# Patient Record
Sex: Male | Born: 1955
Health system: Southern US, Community
[De-identification: ages and names within clinical notes are randomized; demographics above are authoritative.]

## PROBLEM LIST (undated history)

## (undated) DIAGNOSIS — IMO0001 Reserved for inherently not codable concepts without codable children: Secondary | ICD-10-CM

## (undated) DIAGNOSIS — I10 Essential (primary) hypertension: Secondary | ICD-10-CM

## (undated) DIAGNOSIS — R0602 Shortness of breath: Secondary | ICD-10-CM

## (undated) DIAGNOSIS — D696 Thrombocytopenia, unspecified: Secondary | ICD-10-CM

## (undated) DIAGNOSIS — N289 Disorder of kidney and ureter, unspecified: Secondary | ICD-10-CM

## (undated) DIAGNOSIS — M2550 Pain in unspecified joint: Secondary | ICD-10-CM

## (undated) DIAGNOSIS — M1990 Unspecified osteoarthritis, unspecified site: Secondary | ICD-10-CM

## (undated) DIAGNOSIS — H409 Unspecified glaucoma: Secondary | ICD-10-CM

## (undated) DIAGNOSIS — B029 Zoster without complications: Secondary | ICD-10-CM

## (undated) DIAGNOSIS — M25569 Pain in unspecified knee: Secondary | ICD-10-CM

## (undated) DIAGNOSIS — G4730 Sleep apnea, unspecified: Secondary | ICD-10-CM

## (undated) DIAGNOSIS — E079 Disorder of thyroid, unspecified: Secondary | ICD-10-CM

## (undated) DIAGNOSIS — H269 Unspecified cataract: Secondary | ICD-10-CM

## (undated) DIAGNOSIS — E785 Hyperlipidemia, unspecified: Secondary | ICD-10-CM

## (undated) DIAGNOSIS — M549 Dorsalgia, unspecified: Secondary | ICD-10-CM

## (undated) DIAGNOSIS — G4733 Obstructive sleep apnea (adult) (pediatric): Secondary | ICD-10-CM

## (undated) DIAGNOSIS — L719 Rosacea, unspecified: Secondary | ICD-10-CM

## (undated) DIAGNOSIS — R600 Localized edema: Secondary | ICD-10-CM

## (undated) DIAGNOSIS — E669 Obesity, unspecified: Secondary | ICD-10-CM

## (undated) DIAGNOSIS — E039 Hypothyroidism, unspecified: Secondary | ICD-10-CM

## (undated) DIAGNOSIS — G2581 Restless legs syndrome: Secondary | ICD-10-CM

## (undated) HISTORY — DX: Disorder of thyroid, unspecified: E07.9

## (undated) HISTORY — DX: Unspecified osteoarthritis, unspecified site: M19.90

## (undated) HISTORY — DX: Hyperlipidemia, unspecified: E78.5

## (undated) HISTORY — DX: Shortness of breath: R06.02

## (undated) HISTORY — PX: SIGMOIDOSCOPY: SUR1295

## (undated) HISTORY — DX: Restless legs syndrome: G25.81

## (undated) HISTORY — PX: CARPAL TUNNEL RELEASE: SHX101

## (undated) HISTORY — DX: Disorder of kidney and ureter, unspecified: N28.9

## (undated) HISTORY — PX: WISDOM TOOTH EXTRACTION: SHX21

## (undated) HISTORY — DX: Dorsalgia, unspecified: M54.9

## (undated) HISTORY — PX: LASIK: SHX215

## (undated) HISTORY — DX: Unspecified glaucoma: H40.9

## (undated) HISTORY — DX: Hypothyroidism, unspecified: E03.9

## (undated) HISTORY — DX: Thrombocytopenia, unspecified: D69.6

## (undated) HISTORY — PX: EYE SURGERY: SHX253

## (undated) HISTORY — DX: Obstructive sleep apnea (adult) (pediatric): G47.33

## (undated) HISTORY — PX: POLYPECTOMY: SHX149

## (undated) HISTORY — DX: Zoster without complications: B02.9

## (undated) HISTORY — DX: Reserved for inherently not codable concepts without codable children: IMO0001

## (undated) HISTORY — DX: Sleep apnea, unspecified: G47.30

## (undated) HISTORY — DX: Essential (primary) hypertension: I10

## (undated) HISTORY — DX: Obesity, unspecified: E66.9

## (undated) HISTORY — DX: Localized edema: R60.0

## (undated) HISTORY — DX: Pain in unspecified joint: M25.50

## (undated) HISTORY — DX: Rosacea, unspecified: L71.9

## (undated) HISTORY — DX: Pain in unspecified knee: M25.569

## (undated) HISTORY — DX: Unspecified cataract: H26.9

## (undated) HISTORY — PX: HERNIA REPAIR: SHX51

---

## 2004-07-02 ENCOUNTER — Ambulatory Visit: Payer: Self-pay | Admitting: Family Medicine

## 2004-10-02 ENCOUNTER — Ambulatory Visit: Payer: Self-pay | Admitting: Family Medicine

## 2004-10-13 ENCOUNTER — Ambulatory Visit: Payer: Self-pay | Admitting: Family Medicine

## 2005-01-26 ENCOUNTER — Ambulatory Visit: Payer: Self-pay | Admitting: Family Medicine

## 2005-12-07 ENCOUNTER — Ambulatory Visit: Payer: Self-pay | Admitting: Family Medicine

## 2006-03-12 ENCOUNTER — Ambulatory Visit: Payer: Self-pay | Admitting: Family Medicine

## 2006-05-25 ENCOUNTER — Ambulatory Visit: Payer: Self-pay | Admitting: Family Medicine

## 2006-05-25 LAB — CONVERTED CEMR LAB
ALT: 52 units/L — ABNORMAL HIGH (ref 0–40)
AST: 36 units/L (ref 0–37)
BUN: 12 mg/dL (ref 6–23)
Basophils Relative: 0.4 % (ref 0.0–1.0)
Calcium: 9.7 mg/dL (ref 8.4–10.5)
Chloride: 104 meq/L (ref 96–112)
Cholesterol: 264 mg/dL (ref 0–200)
Creatinine, Ser: 1.2 mg/dL (ref 0.4–1.5)
Direct LDL: 173.3 mg/dL
Eosinophils Relative: 2.5 % (ref 0.0–5.0)
GFR calc Af Amer: 82 mL/min
GFR calc non Af Amer: 68 mL/min
HCT: 45.4 % (ref 39.0–52.0)
HDL: 41.2 mg/dL (ref >39.0)
Hemoglobin: 16.1 g/dL (ref 13.0–17.0)
MCHC: 35.5 g/dL (ref 30.0–36.0)
MCV: 99.4 fL (ref 78.0–100.0)
Monocytes Absolute: 0.5 10*3/uL (ref 0.2–0.7)
Neutro Abs: 1.9 10*3/uL (ref 1.4–7.7)
Neutrophils Relative %: 42.7 % — ABNORMAL LOW (ref 43.0–77.0)
PSA: 0.22 ng/mL (ref 0.10–4.00)
RBC: 4.57 M/uL (ref 4.22–5.81)
WBC: 4.4 10*3/uL — ABNORMAL LOW (ref 4.5–10.5)

## 2006-09-06 ENCOUNTER — Ambulatory Visit: Payer: Self-pay | Admitting: Family Medicine

## 2006-09-06 LAB — CONVERTED CEMR LAB
AST: 24 units/L (ref 0–37)
Albumin: 4.5 g/dL (ref 3.5–5.2)
Basophils Absolute: 0 10*3/uL (ref 0.0–0.1)
Bilirubin, Direct: 0.1 mg/dL (ref 0.0–0.3)
Cholesterol: 181 mg/dL (ref 0–200)
Creatinine,U: 146.8 mg/dL
Eosinophils Absolute: 0.1 10*3/uL (ref 0.0–0.6)
Eosinophils Relative: 2.5 % (ref 0.0–5.0)
GFR calc Af Amer: 91 mL/min
GFR calc non Af Amer: 75 mL/min
Glucose, Bld: 106 mg/dL — ABNORMAL HIGH (ref 70–99)
HCT: 45.6 % (ref 39.0–52.0)
Hgb A1c MFr Bld: 5.1 % (ref 4.6–6.0)
Lymphocytes Relative: 36 % (ref 12.0–46.0)
MCHC: 33.9 g/dL (ref 30.0–36.0)
MCV: 98.9 fL (ref 78.0–100.0)
Microalb Creat Ratio: 2.7 mg/g (ref 0.0–30.0)
Microalb, Ur: 0.4 mg/dL (ref 0.0–1.9)
Monocytes Absolute: 0.4 10*3/uL (ref 0.2–0.7)
Neutro Abs: 1.9 10*3/uL (ref 1.4–7.7)
Neutrophils Relative %: 50.5 % (ref 43.0–77.0)
PSA: 0.23 ng/mL (ref 0.10–4.00)
Potassium: 4.5 meq/L (ref 3.5–5.1)
RBC: 4.61 M/uL (ref 4.22–5.81)
Sodium: 142 meq/L (ref 135–145)
Total CHOL/HDL Ratio: 4
Triglycerides: 115 mg/dL (ref 0–149)
WBC: 3.7 10*3/uL — ABNORMAL LOW (ref 4.5–10.5)

## 2006-09-13 ENCOUNTER — Ambulatory Visit: Payer: Self-pay | Admitting: Family Medicine

## 2006-09-21 ENCOUNTER — Ambulatory Visit: Payer: Self-pay | Admitting: Gastroenterology

## 2006-10-04 ENCOUNTER — Ambulatory Visit: Payer: Self-pay | Admitting: Gastroenterology

## 2006-10-04 ENCOUNTER — Encounter: Payer: Self-pay | Admitting: Gastroenterology

## 2007-02-03 DIAGNOSIS — E039 Hypothyroidism, unspecified: Secondary | ICD-10-CM | POA: Insufficient documentation

## 2007-02-03 DIAGNOSIS — J309 Allergic rhinitis, unspecified: Secondary | ICD-10-CM | POA: Insufficient documentation

## 2007-02-08 ENCOUNTER — Encounter: Payer: Self-pay | Admitting: Family Medicine

## 2007-08-02 ENCOUNTER — Ambulatory Visit: Payer: Self-pay | Admitting: Family Medicine

## 2007-08-16 ENCOUNTER — Telehealth: Payer: Self-pay | Admitting: Family Medicine

## 2007-08-18 ENCOUNTER — Telehealth: Payer: Self-pay | Admitting: Family Medicine

## 2007-09-20 ENCOUNTER — Ambulatory Visit: Payer: Self-pay | Admitting: Family Medicine

## 2007-09-20 LAB — CONVERTED CEMR LAB
Bilirubin Urine: NEGATIVE
Nitrite: NEGATIVE
Protein, U semiquant: NEGATIVE
Specific Gravity, Urine: 1.015
Urobilinogen, UA: 0.2

## 2007-09-22 ENCOUNTER — Telehealth: Payer: Self-pay | Admitting: Family Medicine

## 2007-09-23 LAB — CONVERTED CEMR LAB
ALT: 54 units/L — ABNORMAL HIGH (ref 0–53)
AST: 37 units/L (ref 0–37)
Alkaline Phosphatase: 44 units/L (ref 39–117)
Bilirubin, Direct: 0.1 mg/dL (ref 0.0–0.3)
CO2: 31 meq/L (ref 19–32)
Cholesterol: 151 mg/dL (ref 0–200)
Eosinophils Relative: 3 % (ref 0.0–5.0)
Glucose, Bld: 105 mg/dL — ABNORMAL HIGH (ref 70–99)
Hemoglobin: 14.8 g/dL (ref 13.0–17.0)
Lymphocytes Relative: 38.5 % (ref 12.0–46.0)
Monocytes Relative: 10.6 % (ref 3.0–12.0)
Neutro Abs: 1.7 10*3/uL (ref 1.4–7.7)
PSA: 0.29 ng/mL (ref 0.10–4.00)
Platelets: 142 10*3/uL — ABNORMAL LOW (ref 150–400)
Potassium: 4.2 meq/L (ref 3.5–5.1)
RDW: 12.1 % (ref 11.5–14.6)
Sodium: 144 meq/L (ref 135–145)
TSH: 0.49 microintl units/mL (ref 0.35–5.50)
Total CHOL/HDL Ratio: 4.5
Total Protein: 7.3 g/dL (ref 6.0–8.3)
VLDL: 58 mg/dL — ABNORMAL HIGH (ref 0–40)
WBC: 3.5 10*3/uL — ABNORMAL LOW (ref 4.5–10.5)

## 2007-10-18 ENCOUNTER — Ambulatory Visit: Payer: Self-pay | Admitting: Family Medicine

## 2007-10-18 DIAGNOSIS — E119 Type 2 diabetes mellitus without complications: Secondary | ICD-10-CM

## 2007-10-18 DIAGNOSIS — M109 Gout, unspecified: Secondary | ICD-10-CM

## 2007-10-18 DIAGNOSIS — I10 Essential (primary) hypertension: Secondary | ICD-10-CM

## 2008-02-07 ENCOUNTER — Telehealth: Payer: Self-pay | Admitting: Family Medicine

## 2008-08-17 ENCOUNTER — Telehealth: Payer: Self-pay | Admitting: Family Medicine

## 2008-11-06 ENCOUNTER — Telehealth: Payer: Self-pay | Admitting: Family Medicine

## 2008-11-12 ENCOUNTER — Ambulatory Visit: Payer: Self-pay | Admitting: Family Medicine

## 2008-11-12 DIAGNOSIS — E782 Mixed hyperlipidemia: Secondary | ICD-10-CM

## 2009-05-15 ENCOUNTER — Encounter: Payer: Self-pay | Admitting: Family Medicine

## 2009-05-27 ENCOUNTER — Telehealth: Payer: Self-pay | Admitting: Family Medicine

## 2009-06-25 ENCOUNTER — Encounter: Payer: Self-pay | Admitting: Family Medicine

## 2009-12-02 ENCOUNTER — Telehealth: Payer: Self-pay | Admitting: Family Medicine

## 2009-12-17 ENCOUNTER — Ambulatory Visit: Payer: Self-pay | Admitting: Family Medicine

## 2009-12-20 LAB — CONVERTED CEMR LAB
Albumin: 4.5 g/dL (ref 3.5–5.2)
Alkaline Phosphatase: 36 units/L — ABNORMAL LOW (ref 39–117)
Basophils Absolute: 0 10*3/uL (ref 0.0–0.1)
CO2: 32 meq/L (ref 19–32)
Calcium: 10 mg/dL (ref 8.4–10.5)
Cholesterol: 145 mg/dL (ref 0–200)
Creatinine, Ser: 1.2 mg/dL (ref 0.4–1.5)
Creatinine,U: 176.1 mg/dL
Eosinophils Absolute: 0.1 10*3/uL (ref 0.0–0.7)
Glucose, Bld: 122 mg/dL — ABNORMAL HIGH (ref 70–99)
HDL: 37.6 mg/dL — ABNORMAL LOW (ref >39.00)
Hemoglobin: 14 g/dL (ref 13.0–17.0)
Hgb A1c MFr Bld: 5.9 % (ref 4.6–6.5)
Lymphocytes Relative: 32.9 % (ref 12.0–46.0)
MCHC: 34.6 g/dL (ref 30.0–36.0)
Microalb, Ur: 0.6 mg/dL (ref 0.0–1.9)
Neutro Abs: 2.1 10*3/uL (ref 1.4–7.7)
RDW: 12.8 % (ref 11.5–14.6)
Sodium: 144 meq/L (ref 135–145)
TSH: 0.13 microintl units/mL — ABNORMAL LOW (ref 0.35–5.50)
Triglycerides: 135 mg/dL (ref 0.0–149.0)

## 2009-12-24 ENCOUNTER — Ambulatory Visit: Payer: Self-pay | Admitting: Family Medicine

## 2009-12-24 DIAGNOSIS — J342 Deviated nasal septum: Secondary | ICD-10-CM

## 2010-02-08 ENCOUNTER — Encounter: Payer: Self-pay | Admitting: Family Medicine

## 2010-02-17 ENCOUNTER — Encounter: Payer: Self-pay | Admitting: Family Medicine

## 2010-05-04 HISTORY — PX: HEMORRHOID SURGERY: SHX153

## 2010-06-01 LAB — CONVERTED CEMR LAB: TSH: 0.08 microintl units/mL — ABNORMAL LOW (ref 0.35–5.50)

## 2010-06-03 NOTE — Miscellaneous (Signed)
Summary: Immunization Entry   Immunization History:  Influenza Immunization History:    Influenza:  historical (02/26/2009)

## 2010-06-03 NOTE — Progress Notes (Signed)
Summary: refill clonazepam  Phone Note From Pharmacy   [id number] Call For: Wright Gravely  Summary of Call: refill clonazepam 1mg  1 by mouth two times a day Initial call taken by: Alfred Levins, CMA,  May 27, 2009 2:08 PM  Follow-up for Phone Call        call in #60 with 5 rf Follow-up by: Nelwyn Salisbury MD,  May 28, 2009 8:44 AM  Additional Follow-up for Phone Call Additional follow up Details #1::        Phone call completed, Pharmacist called Additional Follow-up by: Alfred Levins, CMA,  May 28, 2009 10:03 AM    Prescriptions: KLONOPIN 1 MG  TABS (CLONAZEPAM) 1 by mouth two times a day  #60 x 5   Entered by:   Alfred Levins, CMA   Authorized by:   Nelwyn Salisbury MD   Signed by:   Alfred Levins, CMA on 05/28/2009   Method used:   Telephoned to ...       [facility] [address] (retail[facility]       [address]       [place], [place]  [address]       Ph: [phone] or [phone]       Fax: [phone]   RxID:   [id number]

## 2010-06-03 NOTE — Progress Notes (Signed)
Summary: RX refill  Phone Note Refill Request Message from:  Fax from Pharmacy on December 02, 2009 1:19 PM  Refills Requested: Medication #1:  KLONOPIN 1 MG  TABS 1 by mouth two times a day   Dosage confirmed as above?Dosage Confirmed Please advise refill?  Initial call taken by: Josph Macho RMA,  December 02, 2009 1:20 PM  Follow-up for Phone Call        call in #60 with no rf. He needs an OV before any more rf  Follow-up by: Nelwyn Salisbury MD,  December 03, 2009 1:49 PM    Prescriptions: Scarlette Calico 1 MG  TABS (CLONAZEPAM) 1 by mouth two times a day  #60 x 0   Entered by:   Josph Macho RMA   Authorized by:   Nelwyn Salisbury MD   Signed by:   Josph Macho RMA on 12/03/2009   Method used:   Telephoned to ...       [id number]       [address]       [place], [place]  [address]       Ph: [phone] or [phone]       Fax: [phone]   RxID:   [id number]   Appended Document: RX refill Left message for pt to return my call. Pt needs to have an appt before any addt refills.

## 2010-06-03 NOTE — Miscellaneous (Signed)
Summary: flu inj at [facility]   Clinical Lists Changes  Observations: Added new observation of FLU VAX: Historical (02/09/2010 11:52)      Immunization History:  Influenza Immunization History:    Influenza:  historical (02/09/2010) given at [facility] lot [id number].gh rn.......Marland Kitchen

## 2010-06-03 NOTE — Letter (Signed)
Summary: [facility]   Imported By: Sherian Rein 07/04/2009 15:08:51  _____________________________________________________________________  External Attachment:    Type:   Image     Comment:   External Document

## 2010-06-03 NOTE — Assessment & Plan Note (Signed)
Summary: CPX/NJR   Vital Signs:  Patient profile:   55 year old male Weight:      238 pounds BMI:     33.31 BP sitting:   110 / 84  (left arm) Cuff size:   regular  Vitals Entered By: Raechel Ache, RN (December 24, 2009 10:30 AM) CC: CPX, labs done. C/o tired.   History of Present Illness: 55 yr old male for a cpx. he feels fine except for chronic nasal congestion from his deviated septum. His hemorrhoids still bother him a lot.   Allergies (verified): No Known Drug Allergies  Past History:  Past Medical History: Allergies High Cholesterol Allergic rhinitis Hypothyroidism Diabetes mellitus, type II Gout restless legs syndrome hemorrhoids  Past Surgical History: Reviewed history from 10/18/2007 and no changes required. Colonoscopy 10-04-06 per Dr. Christella Hartigan, repeat in 5 years  Uvulectomy  Family History: Reviewed history from 02/03/2007 and no changes required. Family History Breast cancer 1st degree relative <50 Family History Diabetes 1st degree relative Family History High cholesterol Family History Hypertension Family History of Cardiovascular disorder  Social History: Reviewed history from 02/03/2007 and no changes required. Occupation: Married Former Smoker Alcohol use-yes Drug use-no Regular exercise-yes  Review of Systems  The patient denies anorexia, fever, weight loss, weight gain, vision loss, decreased hearing, hoarseness, chest pain, syncope, dyspnea on exertion, peripheral edema, prolonged cough, headaches, hemoptysis, abdominal pain, melena, hematochezia, severe indigestion/heartburn, hematuria, incontinence, genital sores, muscle weakness, suspicious skin lesions, transient blindness, difficulty walking, depression, unusual weight change, abnormal bleeding, enlarged lymph nodes, angioedema, breast masses, and testicular masses.    Physical Exam  General:  overweight-appearing.   Head:  Normocephalic and atraumatic without obvious abnormalities.  No apparent alopecia or balding. Eyes:  No corneal or conjunctival inflammation noted. EOMI. Perrla. Funduscopic exam benign, without hemorrhages, exudates or papilledema. Vision grossly normal. Ears:  External ear exam shows no significant lesions or deformities.  Otoscopic examination reveals clear canals, tympanic membranes are intact bilaterally without bulging, retraction, inflammation or discharge. Hearing is grossly normal bilaterally. Nose:  External nasal examination shows no deformity or inflammation. Nasal mucosa are pink and moist without lesions or exudates. Mouth:  Oral mucosa and oropharynx without lesions or exudates.  Teeth in good repair. Neck:  No deformities, masses, or tenderness noted. Chest Wall:  No deformities, masses, tenderness or gynecomastia noted. Lungs:  Normal respiratory effort, chest expands symmetrically. Lungs are clear to auscultation, no crackles or wheezes. Heart:  Normal rate and regular rhythm. S1 and S2 normal without gallop, murmur, click, rub or other extra sounds. EKG normal Abdomen:  Bowel sounds positive,abdomen soft and non-tender without masses, organomegaly or hernias noted. Rectal:  No external abnormalities noted. Normal sphincter tone. No rectal masses or tenderness. heme neg. Genitalia:  Testes bilaterally descended without nodularity, tenderness or masses. No scrotal masses or lesions. No penis lesions or urethral discharge. Prostate:  Prostate gland firm and smooth, no enlargement, nodularity, tenderness, mass, asymmetry or induration. Msk:  No deformity or scoliosis noted of thoracic or lumbar spine.   Pulses:  R and L carotid,radial,femoral,dorsalis pedis and posterior tibial pulses are full and equal bilaterally Extremities:  No clubbing, cyanosis, edema, or deformity noted with normal full range of motion of all joints.   Neurologic:  No cranial nerve deficits noted. Station and gait are normal. Plantar reflexes are down-going bilaterally.  DTRs are symmetrical throughout. Sensory, motor and coordinative functions appear intact. Skin:  Intact without suspicious lesions or rashes Cervical Nodes:  No lymphadenopathy  noted Axillary Nodes:  No palpable lymphadenopathy Inguinal Nodes:  No significant adenopathy Psych:  Cognition and judgment appear intact. Alert and cooperative with normal attention span and concentration. No apparent delusions, illusions, hallucinations   Impression & Recommendations:  Problem # 1:  WELL ADULT EXAM (ICD-V70.0)  Orders: Hemoccult Guaiac-1 spec.(in office) (82270) EKG w/ Interpretation (93000)  Complete Medication List: 1)  Levothyroxine Sodium 200 Mcg Tabs (Levothyroxine sodium) .Marland Kitchen.. 1 by mouth once daily 2)  Klonopin 1 Mg Tabs (Clonazepam) .Marland Kitchen.. 1 by mouth two times a day 3)  Crestor 10 Mg Tabs (Rosuvastatin calcium) .Marland Kitchen.. 1 by mouth once daily 4)  Adult Aspirin Low Strength 81 Mg Tbdp (Aspirin) .Marland Kitchen.. 1 by mouth once daily 5)  Tricor 145 Mg Tabs (Fenofibrate) .Marland Kitchen.. 1 by mouth once daily 6)  Lisinopril 10 Mg Tabs (Lisinopril) .... Once daily 7)  Accu-chek Multiclix Lancets Misc (Lancets) .... As directed 8)  Doxycycline Hyclate 100 Mg Caps (Doxycycline hyclate) .Marland Kitchen.. 1 once daily  Other Orders: Venipuncture (62952) TLB-TSH (Thyroid Stimulating Hormone) (84443-TSH) TLB-T3, Free (Triiodothyronine) (84481-T3FREE) TLB-T4 (Thyrox), Free [phone]) Specimen Handling (02725) ENT Referral (ENT)  Patient Instructions: 1)  It is important that you exercise reguarly at least 20 minutes 5 times a week. If you develop chest pain, have severe difficulty breathing, or feel very tired, stop exercising immediately and seek medical attention.  2)  You need to lose weight. Consider a lower calorie diet and regular exercise.  3)  Check a thyroid panel.  4)  Refer to ENT for the deviated septum. 5)  See surgery for the hemorrhoids.  Prescriptions: KLONOPIN 1 MG  TABS (CLONAZEPAM) 1 by mouth two times a day   #60 x 5   Entered and Authorized by:   Nelwyn Salisbury MD   Signed by:   Nelwyn Salisbury MD on 12/24/2009   Method used:   Print then Give to Patient   RxID:   [id number] LISINOPRIL 10 MG  TABS (LISINOPRIL) once daily  #30 x 11   Entered and Authorized by:   Nelwyn Salisbury MD   Signed by:   Nelwyn Salisbury MD on 12/24/2009   Method used:   Electronically to        [facility] [phone]* (retail)       [address]       [place], [place]  [address]       Ph: [phone] or [phone]       Fax: [phone]   RxID:   [id number] TRICOR 145 MG TABS (FENOFIBRATE) 1 by mouth once daily  #30 x 11   Entered and Authorized by:   Nelwyn Salisbury MD   Signed by:   Nelwyn Salisbury MD on 12/24/2009   Method used:   Electronically to        [facility] [phone][facility] (retail)       [address]       [place], [place]  [address]       Ph: [phone] or [phone]       Fax: [phone]   RxID:   [id number] CRESTOR 10 MG  TABS (ROSUVASTATIN CALCIUM) 1 by mouth once daily  #30 x 11   Entered and Authorized by:   Nelwyn Salisbury MD   Signed by:   Nelwyn Salisbury MD on 12/24/2009   Method used:   Electronically to        [facility]  [address] [phone]* (retail)  [address]       [place], [place]  [address]       Ph: [phone] or [phone]       Fax: [phone]   RxID:   [id number] LEVOTHYROXINE SODIUM 200 MCG TABS (LEVOTHYROXINE SODIUM) 1 by mouth once daily  #30 x 11   Entered and Authorized by:   Nelwyn Salisbury MD   Signed by:   Nelwyn Salisbury MD on 12/24/2009   Method used:   Electronically to        [facility] [phone]* (retail)       [address]       [place], [place]  [address]       Ph: [phone] or [phone]       Fax: [phone]   RxID:   [id number]

## 2010-06-03 NOTE — Miscellaneous (Signed)
Summary: Flu Shot/[facility]  Flu Shot/[facility]   Imported By: Maryln Gottron 02/18/2010 14:27:02  _____________________________________________________________________  External Attachment:    Type:   Image     Comment:   External Document

## 2010-06-10 ENCOUNTER — Encounter: Payer: Self-pay | Admitting: Gastroenterology

## 2010-06-11 ENCOUNTER — Encounter (INDEPENDENT_AMBULATORY_CARE_PROVIDER_SITE_OTHER): Payer: Self-pay | Admitting: *Deleted

## 2010-06-11 ENCOUNTER — Ambulatory Visit (INDEPENDENT_AMBULATORY_CARE_PROVIDER_SITE_OTHER): Payer: PRIVATE HEALTH INSURANCE | Admitting: Gastroenterology

## 2010-06-11 ENCOUNTER — Encounter: Payer: Self-pay | Admitting: Gastroenterology

## 2010-06-11 DIAGNOSIS — K625 Hemorrhage of anus and rectum: Secondary | ICD-10-CM | POA: Insufficient documentation

## 2010-06-19 NOTE — Procedures (Signed)
Summary: Colonoscopy   Colonoscopy  Procedure date:  10/04/2006  Findings:      Location:  [facility].   Patient Name: Edward Olsen, Edward Olsen MRN:  Procedure Procedures: Colonoscopy CPT: [phone].    with polypectomy. CPT: [id number].  Personnel: Endoscopist: Rachael Fee, MD.  Referred By: Gershon Crane, MD.  Exam Location: Exam performed in Endoscopy Suite. Outpatient  Patient Consent: Procedure, Alternatives, Risks and Benefits discussed, consent obtained, from patient. Consent was obtained by the RN.  Indications Symptoms: Hematochezia.  Average Risk Screening Routine.  Comments: MILD INTERMITTENT HEMATOCHEZIA, HEMORRHOIDS SEEN ON SIGMOID 4-5 YEARS. History  Current Medications: Patient is not currently taking Coumadin.  Comments: Patient history reviewed and updated, pre-procedure physical performed prior to initiation of sedation? YES Pre-Exam Physical: Performed Oct 04, 2006. Cardio-pulmonary exam, Abdominal exam, Mental status exam WNL.  Exam Exam: Extent of exam reached: Cecum, extent intended: Cecum.  The cecum was identified by appendiceal orifice and IC valve. Patient position: on left side. Time to Cecum: 00:03:52. Time for Withdrawl: 00:09:37. Colon retroflexion performed. Images taken. ASA Classification: II. Tolerance: good.  Monitoring: Pulse and BP monitoring, Oximetry used. Supplemental O2 given.  Colon Prep Prep results: good.  Sedation Meds: Patient assessed and found to be appropriate for moderate (conscious) sedation. Fentanyl 100 mcg. given IV. Versed 10 mg. given IV.  Findings POLYP: Transverse Colon, Maximum size: 4 mm. sessile polyp. Procedure:  snare without cautery, removed, retrieved, Polyp sent to pathology. Polyp sent to pathology.  POLYP: Rectum, Maximum size: 3 mm. sessile polyp. Procedure:  snare without cautery, removed, retrieved, sent to pathology. sent to pathology.  - NORMAL EXAM: Cecum to Rectum. Comments:  OTHERWISE NORMAL EXAMINATION.  HEMORRHOIDS: External. Size: Large.   Assessment Abnormal examination, see findings above.  Comments: TWO SMALL COLON POYLPS, NO CANCERS.  FAIRLY LARGE EXTERNAL HEMORRHOID THAT IS BLEEDING ABOUT ONCE A DAY (SMALL VOLUME).   Events  Unplanned Interventions: No intervention was required.  Unplanned Events: There were no complications. Plans Comments: IF THE POLYPS ARE TUBULAR ADENOMAS (PRECANCEROUS POLYPS), THE PATIENT WILL NEED A REPEAT COLONOSCOPY IN 5 YEARS.  OTHERWISE, HE SHOULD CONTINUE TO FOLLOW COLORECTAL CANCER SCREENING GUIDELINES WITH A REPEAT COLONOSCOPY IN 10 YEARS.  I WILL REFER HIM TO GENERAL SURGERY TO CONSIDER HEMORRHOID TREATMENT OPTIONS (FIBER SUPPLEMENTS AND TOPICAL TREATMENTS DO NOT SEEM TO BE WORKING).  This report was created from the original endoscopy report, which was reviewed and signed by the above listed endoscopist.     cc: Gershon Crane, MD

## 2010-06-19 NOTE — Letter (Signed)
Summary: [facility]  [address] [place], [place] [address]   Phone: [phone]  Fax: [phone]       Edward Olsen    December 11, 1955    MRN: [id number]        Procedure Day /Date:07/08/10 TUE     Arrival Time:230 pm     Procedure Time:330 pm     Location of Procedure:                    X  [facility] (4th Floor)    PREPARATION FOR FLEXIBLE SIGMOIDOSCOPY WITH MAGNESIUM CITRATE  Prior to the day before your procedure, purchase one 8 oz. bottle of Magnesium Citrate and one Fleet Enema from the laxative section of your drugstore.  _________________________________________________________________________________________________  THE DAY BEFORE YOUR PROCEDURE             DATE: 07/07/10      DAY: MON  1.   Have a clear liquid dinner the night before your procedure.  2.   Do not drink anything colored red or purple.  Avoid juices with pulp.  No orange juice.              CLEAR LIQUIDS INCLUDE: Water Jello Ice Popsicles Tea (sugar ok, no milk/cream) Powdered fruit flavored drinks Coffee (sugar ok, no milk/cream) Gatorade Juice: apple, white grape, white cranberry  Lemonade Clear bullion, consomm, broth Carbonated beverages (any kind) Strained chicken noodle soup Hard Candy   3.   At 7:00 pm the night before your procedure, drink one bottle of Magnesium Citrate over ice.  4.   Drink at least 3 more glasses of clear liquids before bedtime (preferably juices).  5.   Results are expected usually within 1 to 6 hours after taking the Magnesium Citrate.  ___________________________________________________________________________________________________  THE DAY OF YOUR PROCEDURE            DATE: 07/08/10     DAY:TUE  1.   Use Fleet Enema one hour prior to coming for procedure.  2.   You may drink clear liquids until 130 pm (2 hours before exam)       MEDICATION INSTRUCTIONS  Unless otherwise instructed, you should  take regular prescription medications with a small sip of water as early as possible the morning of your procedure.         OTHER INSTRUCTIONS  You will need a responsible adult at least 55 years of age to accompany you and drive you home.   This person must remain in the waiting room during your procedure.  Wear loose fitting clothing that is easily removed.  Leave jewelry and other valuables at home.  However, you may wish to bring a book to read or an iPod/MP3 player to listen to music as you wait for your procedure to start.  Remove all body piercing jewelry and leave at home.  Total time from sign-in until discharge is approximately 2-3 hours.  You should go home directly after your procedure and rest.  You can resume normal activities the day after your procedure.  The day of your procedure you should not:   Drive   Make legal decisions   Operate machinery   Drink alcohol   Return to work  You will receive specific instructions about eating, activities and medications before you leave.   The above instructions have been reviewed and explained to me by   _______________________    I fully understand and can  verbalize these instructions _____________________________ Date _________

## 2010-06-19 NOTE — Assessment & Plan Note (Signed)
History of Present Illness Visit Type: Initial Consult Primary GI MD: Rob Bunting MD Primary Provider: Gershon Crane, MD  Requesting Provider: Manus Rudd, MD  Chief Complaint: rectal bleeding  History of Present Illness:      very pleasant 55 year old man whom I saw about 3-1/2 years ago at the time of a screening colonoscopy.  who had routine screening colonoscopy with me 3 years ago.  I saw some fairly large external hemorrhoids and 2 polyps were removed, recommeded repeat exam in 5 years.  He has had rectal bleeding for quite a while.  Red blood in toilette. Never had hemorrhoid treatments (never surgery).  He sees blood 2-3 days per month.  Can be a lot of blood.  He has no anal pain, but does have swollen feeling at times.  Saw Dr. Corliss Skains yesterday.  He does have swollen internal hmorrhoids. Dr. Corliss Skains wanted my input on whether there might be something else going on distally in his colon  was not anemic 12/2009, was bleeding then.  he has been excercising, never really constipated.           Current Medications (verified): 1)  Levothyroxine Sodium 200 Mcg Tabs (Levothyroxine Sodium) .Marland Kitchen.. 1 By Mouth Once Daily 2)  Klonopin 1 Mg  Tabs (Clonazepam) .Marland Kitchen.. 1 By Mouth Two Times A Day 3)  Crestor 10 Mg  Tabs (Rosuvastatin Calcium) .Marland Kitchen.. 1 By Mouth Once Daily 4)  Adult Aspirin Low Strength 81 Mg  Tbdp (Aspirin) .Marland Kitchen.. 1 By Mouth Once Daily 5)  Tricor 145 Mg Tabs (Fenofibrate) .Marland Kitchen.. 1 By Mouth Once Daily 6)  Lisinopril 10 Mg  Tabs (Lisinopril) .... Once Daily 7)  Accu-Chek Multiclix Lancets  Misc (Lancets) .... As Directed 8)  Doxycycline Hyclate 100 Mg Caps (Doxycycline Hyclate) .Marland Kitchen.. 1 Once Daily 9)  Stool Softener 250 Mg Caps (Docusate Sodium) .... Three Tablets By Mouth Once Daily 10)  Fiber 625 Mg Tabs (Calcium Polycarbophil) .... Six Tablets By Mouth Once Daily 11)  Fish Oil   Oil (Fish Oil) .... One Tablet By Mouth Once Daily  Allergies (verified): No Known Drug  Allergies  Past History:  Past Medical History: Allergies High Cholesterol Allergic rhinitis Hypothyroidism Diabetes mellitus, type II Gout restless legs syndrome hemorrhoids   Precancerous colon polyps, colonoscopy 2008; recall colonoscopy 2013  Past Surgical History: Uvulectomy    Family History: Family History Breast cancer 1st degree relative <50 Family History Diabetes 1st degree relative Family History High cholesterol Family History Hypertension Family History of Cardiovascular disorder    Social History: works in [employer] Married Former Smoker Alcohol use-yes Drug use-no Regular exercise-yes  Review of Systems       Pertinent positive and negative review of systems were noted in the above HPI and GI specific review of systems.  All other review of systems was otherwise negative.   Vital Signs:  Patient profile:   55 year old male Height:      71 inches Weight:      241 pounds BMI:     33.73 BSA:     2.28 Pulse rate:   96 / minute Pulse rhythm:   regular BP sitting:   124 / 82  (left arm) Cuff size:   regular  Vitals Entered By: Ok Anis CMA (June 11, 2010 11:18 AM)  Physical Exam  Additional Exam:  Constitutional: generally well appearing Psychiatric: alert and oriented times 3 Eyes: extraocular movements intact Mouth: oropharynx moist, no lesions Neck: supple, no lymphadenopathy Cardiovascular: heart  regular rate and rythm Lungs: CTA bilaterally Abdomen: soft, non-tender, non-distended, no obvious ascites, no peritoneal signs, normal bowel sounds Extremities: no lower extremity edema bilaterally Skin: no lesions on visible extremities rectal exam deferred for upcoming flexible sigmoidoscopy   Impression & Recommendations:  Problem # 1:  intermittent rectal bleeding we will proceed with flexible sigmoidoscopy to check for other sources of anorectal type intermittent bleeding. If nothing is noted on neck exam then he will be returning  to his general surgeon to go ahead and schedule hemorrhoidectomy.  Patient Instructions: 1)  You will be scheduled to have a flexible sigmoidoscopy. 2)  A copy of this information will be sent to Dr. Corliss Skains. 3)  The medication list was reviewed and reconciled.  All changed / newly prescribed medications were explained.  A complete medication list was provided to the patient / caregiver.  Appended Document: Orders Update/FLEX    Clinical Lists Changes  Problems: Added new problem of HEMORRHAGE OF RECTUM AND ANUS (ICD-569.3) Orders: Added new Test order of Flex with Sedation (Flex w/Sed) - Signed

## 2010-07-08 ENCOUNTER — Other Ambulatory Visit (AMBULATORY_SURGERY_CENTER): Payer: PRIVATE HEALTH INSURANCE | Admitting: Gastroenterology

## 2010-07-08 ENCOUNTER — Encounter: Payer: Self-pay | Admitting: Gastroenterology

## 2010-07-08 DIAGNOSIS — Z86010 Personal history of colonic polyps: Secondary | ICD-10-CM

## 2010-07-08 DIAGNOSIS — K625 Hemorrhage of anus and rectum: Secondary | ICD-10-CM

## 2010-07-08 DIAGNOSIS — K644 Residual hemorrhoidal skin tags: Secondary | ICD-10-CM

## 2010-07-08 DIAGNOSIS — D126 Benign neoplasm of colon, unspecified: Secondary | ICD-10-CM

## 2010-07-08 HISTORY — PX: FLEXIBLE SIGMOIDOSCOPY: SHX5431

## 2010-07-10 NOTE — Letter (Signed)
Summary: [facility]   Imported By: Sherian Rein 07/03/2010 10:42:01  _____________________________________________________________________  External Attachment:    Type:   Image     Comment:   External Document

## 2010-07-15 NOTE — Procedures (Addendum)
Summary: Flexible Sigmoidoscopy  Patient: [name] Note: All result statuses are Final unless otherwise noted.  Tests: (1) Flexible Sigmoidoscopy (FLX)  FLX Flexible Sigmoidoscopy                             DONE     [facility]     [address]     [place], [place]  [address]          FLEXIBLE SIGMOIDOSCOPY PROCEDURE REPORT          PATIENT:  Edward, Olsen  MR#:  [id number]     BIRTHDATE:  05-28-1955, 54 yrs. old  GENDER:  male     ENDOSCOPIST:  Rachael Fee, MD     PROCEDURE DATE:  07/08/2010     PROCEDURE:  Flexible Sigmoidoscopy with polypectomy     ASA CLASS:  Class II     INDICATIONS:  hemorrhoids, rectal bleeding, one adenomatous polyp     in 2008     MEDICATIONS:   none          DESCRIPTION OF PROCEDURE:   After the risks benefits and     alternatives of the procedure were thoroughly explained, informed     consent was obtained.  Digital rectal exam was performed and     revealed no rectal masses.   The [id number] endoscope     was introduced through the anus and advanced to the transverse     colon, without limitations.  The quality of the prep was good.     The instrument was then slowly withdrawn as the mucosa was fully     examined.     <<PROCEDUREIMAGES>>     Internal and external hemorrhoids were found. These were medium     sized (see image5 and image4).  A diminutive polyp was found in     the descending colon. This was removed but not retrieved (see     image3).  The examination was otherwise normal (see image2 and     image1).   Retroflexed views in the rectum revealed no     abnormalities.    The scope was then withdrawn from the patient     and the procedure terminated.     COMPLICATIONS:  None          ENDOSCOPIC IMPRESSION:     1) Internal and external hemorrhoids     2) Diminutive polyp in the descending colon, removed but not     retrieved     3) Otherwise normal examination          RECOMMENDATIONS:  Follow up with Dr. Corliss Skains for hemorrhoid resection.          ______________________________     Rachael Fee, MD          cc: Marcille Blanco, MD          n.     Rosalie Doctor:   Rachael Fee at 07/08/2010 03:18 PM          Sheryle Spray, [id number]  Note: An exclamation mark (!) indicates a result that was not dispersed into the flowsheet. Document Creation Date: 07/08/2010 3:18 PM _______________________________________________________________________  (1) Order result status: Final Collection or observation date-time: 07/08/2010 15:13 Requested date-time:  Receipt date-time:  Reported date-time:  Referring Physician:   Ordering Physician: Rob Bunting [phone]) Specimen Source:  Source: Launa Grill Order Number: [id number] Lab site:

## 2010-07-16 ENCOUNTER — Telehealth: Payer: Self-pay | Admitting: Family Medicine

## 2010-07-16 NOTE — Telephone Encounter (Signed)
Refill Clonazepam.  On Tricor. Wants to switch to [place]. (pt not sure). Please call [facility]  Would like a new rx for Truetrack meter, lancets and strips. Please return call for clarity.

## 2010-07-17 MED ORDER — TRUEPLUS LANCETS 28G MISC: 1.0000 | Freq: Every day | Status: DC

## 2010-07-17 MED ORDER — GLUCOSE BLOOD VI STRP: ORAL_STRIP | Status: DC

## 2010-07-17 MED ORDER — MEIJER TRUETRACK GLUCOSE SYS W/DEVICE KIT: 1.0000 | PACK | Freq: Every day | Status: DC

## 2010-07-17 MED ORDER — CLONAZEPAM 1 MG PO TABS: 1.0000 mg | ORAL_TABLET | Freq: Two times a day (BID) | ORAL | Status: DC | PRN

## 2010-07-17 NOTE — Telephone Encounter (Addendum)
Pt notifed rx called in for clonazepam and rx faxed and called for truetrack glucometer lancets strips  and requested if he wants to switch his tricor return call  Dx code 250.00

## 2010-07-17 NOTE — Telephone Encounter (Signed)
Call in Clonazepam 1 mg bid, #60 with 5 rf. I prefer he stay on Tricor (or he may switch to generic Fenofibrate 160 mg a day). Refill the meter, strips, etc. Dx is 250.00

## 2010-07-23 ENCOUNTER — Other Ambulatory Visit: Payer: Self-pay | Admitting: Family Medicine

## 2010-07-23 MED ORDER — ROSUVASTATIN CALCIUM 10 MG PO TABS: 10.0000 mg | ORAL_TABLET | Freq: Every day | ORAL | Status: DC

## 2010-07-23 MED ORDER — FENOFIBRATE 145 MG PO TABS: 145.0000 mg | ORAL_TABLET | Freq: Every day | ORAL | Status: DC

## 2010-07-23 NOTE — Telephone Encounter (Signed)
Sent tricor and crestor to [facility] as requested

## 2010-07-23 NOTE — Telephone Encounter (Signed)
Pharmacist called and is trying to get renewal on pts Tricor 145 mg and Crestor 10 mg. Pls call in asap.

## 2010-08-04 ENCOUNTER — Telehealth: Payer: Self-pay

## 2010-08-04 NOTE — Telephone Encounter (Signed)
Pt called to report has letter from [facility] with rx  That needs to be faxed with original rx. Pt stated will bring letter over and recommendations over this week.

## 2010-08-07 ENCOUNTER — Other Ambulatory Visit: Payer: Self-pay

## 2010-08-07 MED ORDER — LISINOPRIL 10 MG PO TABS: 10.0000 mg | ORAL_TABLET | Freq: Every day | ORAL | Status: DC

## 2010-08-07 MED ORDER — LEVOTHYROXINE SODIUM 200 MCG PO TABS: 200.0000 ug | ORAL_TABLET | Freq: Every day | ORAL | Status: DC

## 2010-08-07 NOTE — Telephone Encounter (Signed)
rx to [facility] for synthroid and lisinopril #90 0 refills

## 2010-09-16 NOTE — Assessment & Plan Note (Signed)
[facility] OFFICE NOTE   NAME:Edward Olsen, Edward Olsen                     MRN:          [id number]  DATE:09/13/2006                            DOB:          1955-06-17    This is a 55 year old gentleman here for a complete physical  examination.  In general, he feels fine and has no complaints.  We have  been following him for hypothyroidism until a few months ago when he  began seeing Dr. Dorisann Frames for this.  She briefly tried to increase  his levothyroxine dose to 224 mcg per day but after one month cut him  back down to his previous level of 200 mcg per day.  She also noted an  elevated fasting glucose and then got a 2-hour glucose tolerance test.  I do not have the exact results of this but apparently she diagnosed him  with type 2 diabetes.  She recommended diet and exercise and actually  sent him to a nutritionist.  He has been working hard on his diet and  has lost about 5 or 6 pounds of weight over the past couple of weeks.  We have also been following him for elevated lipids.  We started him on  Crestor about 3 months ago, which he has tolerated well.  We now need to  see where this is at.  Dr. Talmage Nap had also noted him to have an elevated  blood pressure in her office.  He has always had a normal blood pressure  with [place] here, and of course we will reevaluate this today.  For other  details of his past medical history, family history, social history,  habits, etc., I refer you to our last physical note for him, dated July 02, 2004.   ALLERGIES:  None.   CURRENT MEDICATIONS:  1. Levothroid 200 mcg per day.  2. Klonopin 1 mg two tablets q.h.s.  3. Crestor 10 mg per day.   OBJECTIVE:  VITAL SIGNS:  Height 5 feet 10 inches, weight 232, BP  122/70, pulse 72 and regular.  GENERAL:  He is a bit overweight.  SKIN:  Clear.  HEENT:  Eyes are clear, ears are clear, pharynx clear.  NECK:  Supple without  lymphadenopathy or masses.  LUNGS:  Clear.  CARDIAC:  Rate and rhythm are regular without gallops, murmurs, rubs.  Distal pulses are full.  EKG is within normal limits.  ABDOMEN:  Soft, normal bowel sounds, nontender, no masses.  GENITALIA:  Normal male.  He is circumcised.  RECTAL:  No mass or tenderness.  Prostate is within normal limits.  Stool hemoccult positive but he does have a single large hemorrhoid  present.  EXTREMITIES:  No clubbing, cyanosis, or edema.  NEUROLOGIC:  Grossly intact.   He was here for fasting labs on May 5.  This was remarkable primarily  for his lipid panel.  LDL has come down tremendously to 112, HDL is  adequate at 45, triglycerides have also come down quite a bit to 115.  Otherwise, his urine microalbumin is normal.  Hemoglobin A1c is  5.1, and  the remainder of his labs are within normal limits.   ASSESSMENT AND PLAN:  1. Complete physical examination.  I reiterated the importance of him      watching his diet, getting more exercise and losing weight.  Will      also set him up for his first screening colonoscopy.  2. Health maintenance.  Advised him to begin taking an aspirin 81 mg      once daily.  3. New onset type 2 diabetes mellitus.  I think this can managed quite      well with diet and exercise.  Will continue to follow closely.  4. Hyperlipidemia.  He is much improved.  However, I would like to get      his LDL closer to 70 according to the latest guidelines since he      now has diabetes.  Will increase Crestor to 20 mg once a day and      check a fasting lipid panel again in 3 months.  5. Hypothyroidism.  Will follow up with Dr. Talmage Nap.  6. History of elevated blood pressure, now well controlled.     Tera Mater. Clent Ridges, MD  Electronically Signed    SAF/MedQ  DD: 09/13/2006  DT: 09/14/2006  Job #: [id number]

## 2010-09-19 ENCOUNTER — Encounter (INDEPENDENT_AMBULATORY_CARE_PROVIDER_SITE_OTHER): Payer: Self-pay | Admitting: Surgery

## 2010-10-08 ENCOUNTER — Encounter (INDEPENDENT_AMBULATORY_CARE_PROVIDER_SITE_OTHER): Payer: Self-pay | Admitting: Surgery

## 2010-10-16 ENCOUNTER — Telehealth: Payer: Self-pay | Admitting: *Deleted

## 2010-10-16 MED ORDER — GLUCOSE BLOOD VI STRP: ORAL_STRIP | Status: AC

## 2010-10-16 NOTE — Telephone Encounter (Signed)
Is he out of refills. #60 rf5 dispensed 3/15

## 2010-10-16 NOTE — Telephone Encounter (Signed)
Pt states that [facility] was suppose to call his pharmacy to transfer this. I advise pt to either [facility] or his local pharmacy to get this tranferred.

## 2010-10-16 NOTE — Telephone Encounter (Signed)
Refill on truetrack test strips 50's- Rx sent to [facility]

## 2010-10-16 NOTE — Telephone Encounter (Signed)
Refill on clonazepam 1mg 

## 2010-11-19 ENCOUNTER — Encounter: Payer: Self-pay | Admitting: Family Medicine

## 2010-11-19 ENCOUNTER — Ambulatory Visit (INDEPENDENT_AMBULATORY_CARE_PROVIDER_SITE_OTHER): Payer: PRIVATE HEALTH INSURANCE | Admitting: Family Medicine

## 2010-11-19 VITALS — BP 112/80 | HR 77 | Temp 98.5°F | Wt 238.0 lb

## 2010-11-19 DIAGNOSIS — H538 Other visual disturbances: Secondary | ICD-10-CM

## 2010-11-19 NOTE — Progress Notes (Signed)
  Subjective:    Patient ID: Edward Olsen, male    DOB: 1955/05/21, 55 y.o.   MRN: [id number]  HPI Here to follow up an episode of transient blurry vision in the right eye. This started several days ago while he was out mowing the grass. The vison in the right eye suddenly became blurry, whilr the left eye vision was normal. There was no loss of visual fields. No HA or dizziness or any other neurologic deficits. This lasted about 24 hours and then resolved. He has felt fine ever since. He saw Dr. Doloris Hall on 11-14-10 ands had a totally normal eye exam. He was worried about possible amaurosis fugax and asked him to see [place]. He takes an 81 mg aspirin daily. He has never had migraines, but his mother did.    Review of Systems  Constitutional: Negative.   HENT: Negative.   Eyes: Positive for visual disturbance. Negative for photophobia, pain and redness.  Respiratory: Negative.   Cardiovascular: Negative.   Neurological: Negative.        Objective:   Physical Exam  Constitutional: He is oriented to person, place, and time. He appears well-developed and well-nourished.  HENT:  Head: Normocephalic and atraumatic.  Right Ear: External ear normal.  Left Ear: External ear normal.  Nose: Nose normal.  Mouth/Throat: Oropharynx is clear and moist. No oropharyngeal exudate.  Eyes: Conjunctivae and EOM are normal. Pupils are equal, round, and reactive to light. No scleral icterus.  Neck: Normal range of motion. Neck supple. No thyromegaly present.  Cardiovascular: Normal rate, regular rhythm, normal heart sounds and intact distal pulses.   Pulmonary/Chest: Effort normal and breath sounds normal.  Lymphadenopathy:    He has no cervical adenopathy.  Neurological: He is alert and oriented to person, place, and time. He has normal reflexes. No cranial nerve deficit. He exhibits normal muscle tone. Coordination normal.          Assessment & Plan:  This was probably an ocular migraine, and I  explained that these were benign. I do not think it was amaurosis fugax, however we will set up carotid dopplers and a brain MRI to evaluate.

## 2010-11-20 ENCOUNTER — Ambulatory Visit (INDEPENDENT_AMBULATORY_CARE_PROVIDER_SITE_OTHER): Payer: PRIVATE HEALTH INSURANCE | Admitting: Surgery

## 2010-11-20 ENCOUNTER — Encounter: Payer: PRIVATE HEALTH INSURANCE | Admitting: Cardiology

## 2010-11-20 ENCOUNTER — Encounter (INDEPENDENT_AMBULATORY_CARE_PROVIDER_SITE_OTHER): Payer: Self-pay | Admitting: Surgery

## 2010-11-20 DIAGNOSIS — K648 Other hemorrhoids: Secondary | ICD-10-CM | POA: Insufficient documentation

## 2010-11-20 DIAGNOSIS — K649 Unspecified hemorrhoids: Secondary | ICD-10-CM

## 2010-11-20 NOTE — Progress Notes (Signed)
The patient is here for a final recheck. The swelling has almost completely resolved. The wound is closed. No drainage. Bowel movements are regular and daily. No tenderness on rectal examination. He may resume full activity. Followup on a p.r.n. basis.

## 2010-11-26 ENCOUNTER — Ambulatory Visit
Admission: RE | Admit: 2010-11-26 | Discharge: 2010-11-26 | Disposition: A | Discharge status: Home / Self Care | Payer: PRIVATE HEALTH INSURANCE | Source: Ambulatory Visit | Attending: Family Medicine | Admitting: Family Medicine

## 2010-11-26 DIAGNOSIS — H538 Other visual disturbances: Secondary | ICD-10-CM

## 2010-11-26 MED ORDER — GADOBENATE DIMEGLUMINE 529 MG/ML IV SOLN
20.0000 mL | Freq: Once | INTRAVENOUS | Status: AC | PRN
  Administered 2010-11-26: 20 mL via INTRAVENOUS

## 2010-12-02 ENCOUNTER — Ambulatory Visit (INDEPENDENT_AMBULATORY_CARE_PROVIDER_SITE_OTHER): Payer: PRIVATE HEALTH INSURANCE | Admitting: *Deleted

## 2010-12-02 ENCOUNTER — Other Ambulatory Visit: Payer: Self-pay | Admitting: Family Medicine

## 2010-12-02 DIAGNOSIS — H538 Other visual disturbances: Secondary | ICD-10-CM

## 2010-12-05 ENCOUNTER — Encounter: Payer: Self-pay | Admitting: Family Medicine

## 2010-12-09 NOTE — Progress Notes (Signed)
Left message with normal results

## 2011-01-09 ENCOUNTER — Other Ambulatory Visit: Payer: Self-pay | Admitting: Family Medicine

## 2011-01-09 NOTE — Telephone Encounter (Signed)
Refill Clonazepam 1mg  to [facility]. Thanks.

## 2011-01-09 NOTE — Telephone Encounter (Signed)
Call in Clonazepam #180 with one rf, also Tricor #90 with 3 rf

## 2011-01-09 NOTE — Telephone Encounter (Signed)
Pt last seen 11/19/10; pls advise.

## 2011-01-09 NOTE — Telephone Encounter (Signed)
Pt called back and said that he also needs refill of fenofibrate (TRICOR) 145 MG tablet in addition to the Clonazepam 1 mg to [facility].

## 2011-01-12 MED ORDER — CLONAZEPAM 1 MG PO TABS: 1.0000 mg | ORAL_TABLET | Freq: Two times a day (BID) | ORAL | Status: DC | PRN

## 2011-01-12 MED ORDER — FENOFIBRATE 145 MG PO TABS: 145.0000 mg | ORAL_TABLET | Freq: Every day | ORAL | Status: DC

## 2011-01-12 NOTE — Telephone Encounter (Signed)
I faxed in 1 script and e-scribed the other 1.

## 2011-02-04 ENCOUNTER — Other Ambulatory Visit: Payer: Self-pay | Admitting: Family Medicine

## 2011-02-04 NOTE — Telephone Encounter (Signed)
Pt called req refill of rosuvastatin (CRESTOR) 10 MG tablet to [facility]. 90 day supply with refills

## 2011-02-05 MED ORDER — ROSUVASTATIN CALCIUM 10 MG PO TABS: 10.0000 mg | ORAL_TABLET | Freq: Every day | ORAL | Status: DC

## 2011-02-05 NOTE — Telephone Encounter (Signed)
rx has been sent in to [facility].

## 2011-05-01 ENCOUNTER — Telehealth: Payer: Self-pay | Admitting: Family Medicine

## 2011-05-01 NOTE — Telephone Encounter (Signed)
Pt wants to know if he is suppose to be taking Fenofibrate or the Brand Tricor? Pt req to take the generic. Pt just recvd Fenofibrate from [facility] this wk.

## 2011-05-01 NOTE — Telephone Encounter (Signed)
I spoke with pt and he just got his mail order with the Fenofibrate, he stated that he has been on the brand name and wants to make sure he can now take the generic. He is fine with the generic, since it is much cheaper.

## 2011-05-05 HISTORY — PX: SEPTOPLASTY: SUR1290

## 2011-05-06 ENCOUNTER — Other Ambulatory Visit: Payer: Self-pay | Admitting: Family Medicine

## 2011-05-06 NOTE — Telephone Encounter (Signed)
Yes the generic is what I intended for him to take

## 2011-05-06 NOTE — Telephone Encounter (Signed)
Spoke with pt

## 2011-07-28 ENCOUNTER — Telehealth: Payer: Self-pay | Admitting: Family Medicine

## 2011-07-28 NOTE — Telephone Encounter (Signed)
Refill request for Clonazepam 1 mg take 1 po bid prn and pt last here on 11/09/10.

## 2011-07-29 MED ORDER — CLONAZEPAM 1 MG PO TABS: 1.0000 mg | ORAL_TABLET | Freq: Two times a day (BID) | ORAL | Status: DC | PRN

## 2011-07-29 NOTE — Telephone Encounter (Signed)
Rx printed to be faxed

## 2011-07-29 NOTE — Telephone Encounter (Signed)
Call in #180 with one rf  

## 2011-08-01 ENCOUNTER — Other Ambulatory Visit: Payer: Self-pay | Admitting: Family Medicine

## 2011-08-12 ENCOUNTER — Encounter: Payer: Self-pay | Admitting: Gastroenterology

## 2011-09-09 ENCOUNTER — Encounter: Payer: Self-pay | Admitting: Gastroenterology

## 2011-10-27 ENCOUNTER — Other Ambulatory Visit: Payer: Self-pay | Admitting: Family Medicine

## 2011-10-28 NOTE — Telephone Encounter (Signed)
Can we refill this? 

## 2011-11-04 ENCOUNTER — Other Ambulatory Visit: Payer: PRIVATE HEALTH INSURANCE | Admitting: Gastroenterology

## 2011-11-05 ENCOUNTER — Other Ambulatory Visit: Payer: Self-pay | Admitting: Family Medicine

## 2011-12-11 ENCOUNTER — Other Ambulatory Visit: Payer: Self-pay

## 2011-12-11 ENCOUNTER — Other Ambulatory Visit: Payer: Self-pay | Admitting: Family Medicine

## 2011-12-11 MED ORDER — GLUCOSE BLOOD VI STRP: 1.0000 | ORAL_STRIP | Freq: Every day | Status: DC

## 2011-12-22 ENCOUNTER — Other Ambulatory Visit (INDEPENDENT_AMBULATORY_CARE_PROVIDER_SITE_OTHER): Payer: PRIVATE HEALTH INSURANCE

## 2011-12-22 DIAGNOSIS — Z0000 Encounter for general adult medical examination without abnormal findings: Secondary | ICD-10-CM

## 2011-12-22 LAB — CBC WITH DIFFERENTIAL/PLATELET
Basophils Relative: 0.6 % (ref 0.0–3.0)
Eosinophils Absolute: 0.1 10*3/uL (ref 0.0–0.7)
Eosinophils Relative: 2.5 % (ref 0.0–5.0)
HCT: 41.1 % (ref 39.0–52.0)
Hemoglobin: 13.9 g/dL (ref 13.0–17.0)
Lymphs Abs: 1.3 10*3/uL (ref 0.7–4.0)
MCHC: 33.9 g/dL (ref 30.0–36.0)
MCV: 96.2 fl (ref 78.0–100.0)
Monocytes Absolute: 0.3 10*3/uL (ref 0.1–1.0)
Neutro Abs: 1.6 10*3/uL (ref 1.4–7.7)
RBC: 4.28 Mil/uL (ref 4.22–5.81)
WBC: 3.4 10*3/uL — ABNORMAL LOW (ref 4.5–10.5)

## 2011-12-22 LAB — LIPID PANEL
Cholesterol: 131 mg/dL (ref 0–200)
LDL Cholesterol: 61 mg/dL (ref 0–99)
Triglycerides: 192 mg/dL — ABNORMAL HIGH (ref 0.0–149.0)

## 2011-12-22 LAB — MICROALBUMIN / CREATININE URINE RATIO
Creatinine,U: 84.6 mg/dL
Microalb, Ur: 0.7 mg/dL (ref 0.0–1.9)

## 2011-12-22 LAB — HEPATIC FUNCTION PANEL
ALT: 35 U/L (ref 0–53)
Albumin: 4.3 g/dL (ref 3.5–5.2)
Bilirubin, Direct: 0.1 mg/dL (ref 0.0–0.3)
Total Protein: 6.8 g/dL (ref 6.0–8.3)

## 2011-12-22 LAB — BASIC METABOLIC PANEL
CO2: 27 mEq/L (ref 19–32)
Chloride: 109 mEq/L (ref 96–112)
Creatinine, Ser: 1.1 mg/dL (ref 0.4–1.5)
Potassium: 4.5 mEq/L (ref 3.5–5.1)
Sodium: 142 mEq/L (ref 135–145)

## 2011-12-22 LAB — HEMOGLOBIN A1C: Hgb A1c MFr Bld: 5.9 % (ref 4.6–6.5)

## 2011-12-24 NOTE — Progress Notes (Signed)
Quick Note:  I spoke with pt ______ 

## 2011-12-28 ENCOUNTER — Encounter: Payer: Self-pay | Admitting: Family Medicine

## 2011-12-28 ENCOUNTER — Ambulatory Visit (INDEPENDENT_AMBULATORY_CARE_PROVIDER_SITE_OTHER): Payer: PRIVATE HEALTH INSURANCE | Admitting: Family Medicine

## 2011-12-28 VITALS — BP 116/74 | HR 101 | Temp 98.8°F | Ht 70.75 in | Wt 234.0 lb

## 2011-12-28 DIAGNOSIS — Z0000 Encounter for general adult medical examination without abnormal findings: Secondary | ICD-10-CM

## 2011-12-28 MED ORDER — DOXYCYCLINE HYCLATE 100 MG PO CAPS: 100.0000 mg | ORAL_CAPSULE | Freq: Every day | ORAL | Status: DC

## 2011-12-28 MED ORDER — LEVOTHYROXINE SODIUM 200 MCG PO TABS: 200.0000 ug | ORAL_TABLET | Freq: Every day | ORAL | Status: DC

## 2011-12-28 MED ORDER — PRAMIPEXOLE DIHYDROCHLORIDE 0.5 MG PO TABS: 0.5000 mg | ORAL_TABLET | Freq: Every day | ORAL | Status: DC

## 2011-12-28 MED ORDER — FENOFIBRATE 145 MG PO TABS: 145.0000 mg | ORAL_TABLET | Freq: Every day | ORAL | Status: DC

## 2011-12-28 MED ORDER — ROSUVASTATIN CALCIUM 10 MG PO TABS: 10.0000 mg | ORAL_TABLET | Freq: Every day | ORAL | Status: DC

## 2011-12-28 MED ORDER — LISINOPRIL 10 MG PO TABS: 10.0000 mg | ORAL_TABLET | Freq: Every day | ORAL | Status: DC

## 2011-12-28 NOTE — Progress Notes (Signed)
  Subjective:    Patient ID: Edward Olsen, male    DOB: 08/30/1955, 56 y.o.   MRN: [id number]  HPI 56 yr old male for a cpx. He feels well and is watching his diet. His only concern is of restless legs during the night. He is unaware of this and sleeps well, however this bothers his wife all night. Clonazepam no longer helps much.    Review of Systems  Constitutional: Negative.   HENT: Negative.   Eyes: Negative.   Respiratory: Negative.   Cardiovascular: Negative.   Gastrointestinal: Negative.   Genitourinary: Negative.   Musculoskeletal: Negative.   Skin: Negative.   Neurological: Negative.   Hematological: Negative.   Psychiatric/Behavioral: Negative.        Objective:   Physical Exam  Constitutional: He is oriented to person, place, and time. He appears well-developed and well-nourished. No distress.  HENT:  Head: Normocephalic and atraumatic.  Right Ear: External ear normal.  Left Ear: External ear normal.  Nose: Nose normal.  Mouth/Throat: Oropharynx is clear and moist. No oropharyngeal exudate.  Eyes: Conjunctivae and EOM are normal. Pupils are equal, round, and reactive to light. Right eye exhibits no discharge. Left eye exhibits no discharge. No scleral icterus.  Neck: Neck supple. No JVD present. No tracheal deviation present. No thyromegaly present.  Cardiovascular: Normal rate, regular rhythm, normal heart sounds and intact distal pulses.  Exam reveals no gallop and no friction rub.   No murmur heard.      EKG normal   Pulmonary/Chest: Effort normal and breath sounds normal. No respiratory distress. He has no wheezes. He has no rales. He exhibits no tenderness.  Abdominal: Soft. Bowel sounds are normal. He exhibits no distension and no mass. There is no tenderness. There is no rebound and no guarding.  Genitourinary: Rectum normal, prostate normal and penis normal. Guaiac negative stool. No penile tenderness.  Musculoskeletal: Normal range of motion. He exhibits  no edema and no tenderness.  Lymphadenopathy:    He has no cervical adenopathy.  Neurological: He is alert and oriented to person, place, and time. He has normal reflexes. No cranial nerve deficit. He exhibits normal muscle tone. Coordination normal.  Skin: Skin is warm and dry. No rash noted. He is not diaphoretic. No erythema. No pallor.  Psychiatric: He has a normal mood and affect. His behavior is normal. Judgment and thought content normal.          Assessment & Plan:  Well exam. Stop Clonazepam and try Mirapex for the restless legs. He will call with a report in 2 weeks. He is due for another colonoscopy and he will set this up soon.

## 2012-01-18 ENCOUNTER — Telehealth: Payer: Self-pay | Admitting: Family Medicine

## 2012-01-18 NOTE — Telephone Encounter (Signed)
Pt cannot refill Mirapex, pharm states too soon. He is out so soon because he was originally taking 1 a day. Pt states you said he could up it to 2 a day if needed, which he did, and so he is out. Can this be rewritten for 2/day and sent in to pharmacy with an ok to fill?

## 2012-01-19 MED ORDER — PRAMIPEXOLE DIHYDROCHLORIDE 0.5 MG PO TABS: ORAL_TABLET | ORAL | Status: DC

## 2012-01-19 NOTE — Telephone Encounter (Signed)
Rx sent to pharmacy. Spoke with pharmacist to make aware ok to fill.

## 2012-01-19 NOTE — Telephone Encounter (Signed)
Call in Mirapex 0.5 mg to take 2 at bedtime, #60 with 11 rf

## 2012-03-14 ENCOUNTER — Other Ambulatory Visit: Payer: Self-pay | Admitting: Family Medicine

## 2012-04-08 ENCOUNTER — Telehealth: Payer: Self-pay | Admitting: Family Medicine

## 2012-04-08 NOTE — Telephone Encounter (Signed)
Patient Information:  Caller Name: Nobel  Phone: [phone]  Patient: Edward Olsen, Edward Olsen  Gender: Male  DOB: 1955-07-07  Age: 56 Years  PCP: Gershon Crane [facility])   Symptoms  Reason For Call & Symptoms: Patient states he controls his diabetes by weight/diet.  from 02/05/12 until today he has gained 12 lbs.  His blood sugar has been fairly constant 130-160 _+frequent urination.  He states he is mointoring diet and carbs. LOV physical August , 2013. He and his wife were doing Atkins diet up until September and then they started adding back carbs. Weight 243.lbs.  He just does not feel well. Does describe some abdominal lower discomfort/nagging and feels better when he eat. No urinary issues. No changes in BM.  Reviewed Health History In EMR: Yes  Reviewed Medications In EMR: Yes  Reviewed Allergies In EMR: Yes  Reviewed Surgeries / Procedures: No  Date of Onset of Symptoms: 03/25/2012  Guideline(s) Used:  Diabetes - High Blood Sugar  Disposition Per Guideline:   Home Care  Reason For Disposition Reached:   Blood glucose 60-240 mg/dl (3.5 -13 mmol/l)  Advice Given:  General  Symptoms of mild hyperglycemia: frequent urination, increased thirst, fatigue, blurred vision.  Symptoms of severe hyperglycemia: weakness, progressing to confusion and coma.  Treatment - Liquids  Drink at least one glass (8 oz or 240 ml) of water per hour for the next 4 hours. (Reason: adequate hydration will reduce hyperglycemia).  Generally, you should try to drink 6-8 glasses of water each day.  Measure and Record Your Blood Glucose  Every day you should measure your blood glucose before breakfast and before going to bed.  Record the results and show them to your doctor at your next office visit.  Daily Blood Glucose Goals  Pre-prandial (before meal): 70-130 mg/dL (0.9-8.1 mmol/l)  Post-prandial (2-3 hours after a meal): Less than 180 mg/dL (10 mmol/l)  Daily Blood Glucose Goals - Gestational  Diabetes in Pregnancy  You and your doctor should decide on what your blood glucose goals should be. Typical goals for most pregnant women who perform daily finger-stick blood testing at home are as shown below.  Pre-prandial (before meal): less than 95 mg/dL (5.3 mmol/l)  Post-prandial (2-3 hours after a meal): Less than 120 mg/dL (6.7 mmol/l)  Expected Course  Your blood sugar continues to get above 240 mg/dl (13 mmol/l).  Your blood sugar continues to be higher than your daily glucose goals (set by you and your doctor).  It has been longer than 6 months since you had an Hemoglobin A1C test.  Call Back If:  Blood glucose more than 300 mg/dL (19.1 mmol/l), 2 or more times in a row.  Vomiting lasting more than 4 hours or unable to drink any liquids.  Rapid breathing occurs  You become worse.  General  Drink liquids. It is important to prevent dehydration. Drink small amounts frequently.  Diet  Appetite OK, minimal nausea: Continue your normal diabetic meal plan. Avoid spicy or greasy foods.  Liquids  Drink more fluids, at least 8-10 glasses daily (8 oz or 240 ml each glass).  Even more liquids are needed if there is fever, vomiting, or diarrhea.  Office Follow Up:  Does the office need to follow up with this patient?: Yes  Instructions For The Office: Patient needs appt for evaluation. Elevation in blood sugars, weight gain and not feeling well.  Will keep a food journal, increase water, check sugars and log prior to appt.  RN  Note:  A1 C in August 5.9 (normal). Checking blood sugars once daily. Soda's x4 daily- diet coke. Discussed the importance water, diet, and food journal.  checking blood sugar in morning and before going to bed and logging.  PATIENT NEEDS APPT FOR EVALUATION.

## 2012-04-08 NOTE — Telephone Encounter (Signed)
I left voice message with below advise.

## 2012-04-08 NOTE — Telephone Encounter (Signed)
Have him see me next week

## 2012-04-12 ENCOUNTER — Ambulatory Visit (INDEPENDENT_AMBULATORY_CARE_PROVIDER_SITE_OTHER): Payer: PRIVATE HEALTH INSURANCE | Admitting: Family Medicine

## 2012-04-12 ENCOUNTER — Encounter: Payer: Self-pay | Admitting: Family Medicine

## 2012-04-12 VITALS — BP 146/90 | HR 88 | Temp 98.6°F | Wt 249.0 lb

## 2012-04-12 DIAGNOSIS — E119 Type 2 diabetes mellitus without complications: Secondary | ICD-10-CM

## 2012-04-12 DIAGNOSIS — I10 Essential (primary) hypertension: Secondary | ICD-10-CM

## 2012-04-12 NOTE — Progress Notes (Signed)
  Subjective:    Patient ID: Edward Olsen, male    DOB: 1956/01/30, 56 y.o.   MRN: [id number]  HPI Here for elevated glucoses. When we saw him for a cpx in August he was doing well with an A1c of 5.9 and a normal BP. At that time he was on the Atkins diet with his wife and he was exercising regularly. Now for the past few months he has gone back to eating carbs and he has stopped exercising. He has put on weight, and he does not feel good. His am fasting glucoses have been in the range of 130 to 160.   Review of Systems  Constitutional: Positive for fatigue.  Respiratory: Negative.   Cardiovascular: Negative.        Objective:   Physical Exam  Constitutional: He is oriented to person, place, and time. He appears well-developed and well-nourished.  Neck: Thyromegaly present.  Cardiovascular: Normal rate, regular rhythm, normal heart sounds and intact distal pulses.   Pulmonary/Chest: Effort normal and breath sounds normal.  Lymphadenopathy:    He has no cervical adenopathy.  Neurological: He is alert and oriented to person, place, and time.          Assessment & Plan:  His weight gain, his elevated glucoses, and his elevated BP are all the result of his lifestyle changes. We agreed that he would get back on a reduced carb diet and would restart exercising. Recheck in 3 months

## 2012-04-21 ENCOUNTER — Other Ambulatory Visit: Payer: Self-pay | Admitting: Family Medicine

## 2012-06-13 ENCOUNTER — Encounter: Payer: Self-pay | Admitting: Gastroenterology

## 2012-06-21 ENCOUNTER — Telehealth: Payer: Self-pay | Admitting: Family Medicine

## 2012-06-21 NOTE — Telephone Encounter (Signed)
Patient Information:  Caller Name: Forrester  Phone: [phone]  Patient: Edward Olsen, Edward Olsen  Gender: Male  DOB: [birth year]  Age: 57 Years  PCP: Gershon Crane [facility])  Office Follow Up:  Does the office need to follow up with this patient?: No  Instructions For The Office: N/A  RN Note:  for couple weeks having episodes of muscle cramps in arms and legs.  will alternate sometimes just legs, sometimes just arms and occasionally all at same time.  He said happening at least once a week and will keep him awake  Blood sugars have been 98-120 in mornings.  No chest pain or shortness of breath.  Used muscle pain protocols  Home care advice given  advised if cramps to sit in tub warm water,  extra fluids and  Tylenol or Ibuprofen for any discomfort  Symptoms  Reason For Call & Symptoms: muscle spasms in legs and arms  Reviewed Health History In EMR: Yes  Reviewed Medications In EMR: Yes  Reviewed Allergies In EMR: Yes  Reviewed Surgeries / Procedures: Yes  Date of Onset of Symptoms: 05/31/2012  Guideline(s) Used:  No Protocol Available - Sick Adult  Disposition Per Guideline:   See Today or Tomorrow in Office  Reason For Disposition Reached:   Nursing judgment  Advice Given:  Call Back If:  New symptoms develop  You become worse.  Appointment Scheduled:  06/22/2012 10:15:00 Appointment Scheduled Provider:  Gershon Crane [facility])

## 2012-06-22 ENCOUNTER — Ambulatory Visit (INDEPENDENT_AMBULATORY_CARE_PROVIDER_SITE_OTHER): Payer: PRIVATE HEALTH INSURANCE | Admitting: Family Medicine

## 2012-06-22 ENCOUNTER — Encounter: Payer: Self-pay | Admitting: Family Medicine

## 2012-06-22 VITALS — BP 144/90 | HR 82 | Temp 98.2°F | Wt 250.0 lb

## 2012-06-22 DIAGNOSIS — G2581 Restless legs syndrome: Secondary | ICD-10-CM | POA: Insufficient documentation

## 2012-06-22 MED ORDER — PRAMIPEXOLE DIHYDROCHLORIDE 0.5 MG PO TABS: 2.0000 mg | ORAL_TABLET | Freq: Every day | ORAL | Status: DC

## 2012-06-22 NOTE — Progress Notes (Signed)
  Subjective:    Patient ID: ALP GOLDWATER, male    DOB: 1955/06/09, 57 y.o.   MRN: [id number]  HPI Here for worsening of the involuntary jerks of his legs, which now happens during the daytime as well. They keep him awake at night. He has gotten some arm jerks recently also. No aches or pains.    Review of Systems  Constitutional: Negative.   Neurological: Negative.        Objective:   Physical Exam  Constitutional: He is oriented to person, place, and time. He appears well-developed and well-nourished.  Cardiovascular: Normal rate, regular rhythm, normal heart sounds and intact distal pulses.   Pulmonary/Chest: Effort normal and breath sounds normal.  Neurological: He is alert and oriented to person, place, and time. He has normal reflexes. No cranial nerve deficit. He exhibits normal muscle tone. Coordination normal.          Assessment & Plan:  We will try taking 3 tablets of Mirapex qhs for a week, and then go up to 4 tablets if needed. He will give me a report in 2 weeks

## 2012-07-07 ENCOUNTER — Ambulatory Visit (AMBULATORY_SURGERY_CENTER): Payer: PRIVATE HEALTH INSURANCE | Admitting: *Deleted

## 2012-07-07 VITALS — Ht 72.0 in | Wt 250.2 lb

## 2012-07-07 DIAGNOSIS — Z1211 Encounter for screening for malignant neoplasm of colon: Secondary | ICD-10-CM

## 2012-07-07 MED ORDER — NA SULFATE-K SULFATE-MG SULF 17.5-3.13-1.6 GM/177ML PO SOLN: ORAL | Status: DC

## 2012-07-20 ENCOUNTER — Encounter: Payer: Self-pay | Admitting: Gastroenterology

## 2012-07-20 ENCOUNTER — Ambulatory Visit (AMBULATORY_SURGERY_CENTER): Payer: PRIVATE HEALTH INSURANCE | Admitting: Gastroenterology

## 2012-07-20 ENCOUNTER — Other Ambulatory Visit: Payer: Self-pay | Admitting: Gastroenterology

## 2012-07-20 VITALS — BP 122/67 | HR 71 | Temp 97.6°F | Resp 20 | Ht 72.0 in | Wt 250.0 lb

## 2012-07-20 DIAGNOSIS — Z1211 Encounter for screening for malignant neoplasm of colon: Secondary | ICD-10-CM

## 2012-07-20 DIAGNOSIS — Z86010 Personal history of colonic polyps: Secondary | ICD-10-CM

## 2012-07-20 DIAGNOSIS — D126 Benign neoplasm of colon, unspecified: Secondary | ICD-10-CM

## 2012-07-20 DIAGNOSIS — K5730 Diverticulosis of large intestine without perforation or abscess without bleeding: Secondary | ICD-10-CM

## 2012-07-20 HISTORY — PX: COLONOSCOPY: SHX174

## 2012-07-20 MED ORDER — SODIUM CHLORIDE 0.9 % IV SOLN: 500.0000 mL | INTRAVENOUS | Status: DC

## 2012-07-20 NOTE — Progress Notes (Signed)
Pt tolerated procedure well. ewm

## 2012-07-20 NOTE — Progress Notes (Signed)
Patient did not experience any of the following events: a burn prior to discharge; a fall within the facility; wrong site/side/patient/procedure/implant event; or a hospital transfer or hospital admission upon discharge from the facility. (G8907) Patient did not have preoperative order for IV antibiotic SSI prophylaxis. (G8918)  

## 2012-07-20 NOTE — Patient Instructions (Addendum)
Impressions/Recommendations:  Polyps (handout given) Diverticulosis (handout given)  High Fiber Diet (handout given)  Repeat colonoscopy pending pathology reports.  YOU HAD AN ENDOSCOPIC PROCEDURE TODAY AT THE [facility]: Refer to the procedure report that was given to you for any specific questions about what was found during the examination.  If the procedure report does not answer your questions, please call your gastroenterologist to clarify.  If you requested that your care partner not be given the details of your procedure findings, then the procedure report has been included in a sealed envelope for you to review at your convenience later.  YOU SHOULD EXPECT: Some feelings of bloating in the abdomen. Passage of more gas than usual.  Walking can help get rid of the air that was put into your GI tract during the procedure and reduce the bloating. If you had a lower endoscopy (such as a colonoscopy or flexible sigmoidoscopy) you may notice spotting of blood in your stool or on the toilet paper. If you underwent a bowel prep for your procedure, then you may not have a normal bowel movement for a few days.  DIET: Your first meal following the procedure should be a light meal and then it is ok to progress to your normal diet.  A half-sandwich or bowl of soup is an example of a good first meal.  Heavy or fried foods are harder to digest and may make you feel nauseous or bloated.  Likewise meals heavy in dairy and vegetables can cause extra gas to form and this can also increase the bloating.  Drink plenty of fluids but you should avoid alcoholic beverages for 24 hours.  ACTIVITY: Your care partner should take you home directly after the procedure.  You should plan to take it easy, moving slowly for the rest of the day.  You can resume normal activity the day after the procedure however you should NOT DRIVE or use heavy machinery for 24 hours (because of the sedation medicines used during  the test).    SYMPTOMS TO REPORT IMMEDIATELY: A gastroenterologist can be reached at any hour.  During normal business hours, 8:30 AM to 5:00 PM Monday through Friday, call [phone].  After hours and on weekends, please call the GI answering service at [phone] who will take a message and have the physician on call contact you.   Following lower endoscopy (colonoscopy or flexible sigmoidoscopy):  Excessive amounts of blood in the stool  Significant tenderness or worsening of abdominal pains  Swelling of the abdomen that is new, acute  Fever of 100F or higher   FOLLOW UP: If any biopsies were taken you will be contacted by phone or by letter within the next 1-3 weeks.  Call your gastroenterologist if you have not heard about the biopsies in 3 weeks.  Our staff will call the home number listed on your records the next business day following your procedure to check on you and address any questions or concerns that you may have at that time regarding the information given to you following your procedure. This is a courtesy call and so if there is no answer at the home number and we have not heard from you through the emergency physician on call, we will assume that you have returned to your regular daily activities without incident.  SIGNATURES/CONFIDENTIALITY: You and/or your care partner have signed paperwork which will be entered into your electronic medical record.  These signatures attest to the fact that that  the information above on your After Visit Summary has been reviewed and is understood.  Full responsibility of the confidentiality of this discharge information lies with you and/or your care-partner.

## 2012-07-20 NOTE — Op Note (Signed)
[facility] [address] [place], [address]   COLONOSCOPY PROCEDURE REPORT  PATIENT: Edward Olsen, Edward Olsen  MR#: [id number] BIRTHDATE: 04/20/1956 , 56  yrs. old GENDER: Male ENDOSCOPIST: Rachael Fee, MD PROCEDURE DATE:  07/20/2012 PROCEDURE:   Colonoscopy with snare polypectomy ASA CLASS:   Class II INDICATIONS:small adenomatous polyp removed 2008 (also small HP). MEDICATIONS: Fentanyl 50 mcg IV, Versed 6 mg IV, and These medications were titrated to patient response per physician's verbal order  DESCRIPTION OF PROCEDURE:   After the risks benefits and alternatives of the procedure were thoroughly explained, informed consent was obtained.  A digital rectal exam revealed no abnormalities of the rectum.   The LB [id number]  endoscope was introduced through the anus and advanced to the cecum, which was identified by both the appendix and ileocecal valve. No adverse events experienced.   The quality of the prep was Moviprep fair The instrument was then slowly withdrawn as the colon was fully examined.   COLON FINDINGS: Two small polyps were found, removed, one was retrieved and sent to pathology.  These were both in ascending colon, sessile, 2-40mm across, removed with cold snare.  There were multiple diverticulum in left colon.  The examination was otherwise normal.  Retroflexed views revealed no abnormalities. The time to cecum=3 minutes 36 seconds.  Withdrawal time=9 minutes 20 seconds. The scope was withdrawn and the procedure completed. COMPLICATIONS: There were no complications.  ENDOSCOPIC IMPRESSION: Two small polyps were found, removed, one was retrieved and sent to pathology. There were multiple diverticulum in left colon. The examination was otherwise normal.  RECOMMENDATIONS: If the polyp(s) removed today are proven to be adenomatous (pre-cancerous) polyps, you will need a repeat colonoscopy in 5 years.  Otherwise you should continue to  follow colorectal cancer screening guidelines for "routine risk" patients with colonoscopy in 10 years.  You will receive a letter within 1-2 weeks with the results of your biopsy as well as final recommendations.  Please call my office if you have not received a letter after 3 weeks.   eSigned:  Rachael Fee, MD 07/20/2012 8:55 AM   cc:  Erskine Emery, MD

## 2012-07-21 ENCOUNTER — Telehealth: Payer: Self-pay | Admitting: *Deleted

## 2012-07-21 NOTE — Telephone Encounter (Signed)
  Follow up Call-  Call back number 07/20/2012  Post procedure Call Back phone  # cell [phone]  Permission to leave phone message Yes     Patient questions:  Do you have a fever, pain , or abdominal swelling? no Pain Score  0 *  Have you tolerated food without any problems? yes  Have you been able to return to your normal activities? yes  Do you have any questions about your discharge instructions: Diet   no Medications  no Follow up visit  no  Do you have questions or concerns about your Care? no  Actions: * If pain score is 4 or above: No action needed, pain <4.

## 2012-07-26 ENCOUNTER — Encounter: Payer: Self-pay | Admitting: Gastroenterology

## 2012-09-01 ENCOUNTER — Telehealth: Payer: Self-pay | Admitting: Family Medicine

## 2012-09-01 DIAGNOSIS — R635 Abnormal weight gain: Secondary | ICD-10-CM

## 2012-09-01 NOTE — Telephone Encounter (Signed)
Pt wanted to inform you that he had to increase his pramipexole (MIRAPEX) 0.5 MG tablet to 6 /night. When increased to 4, that did not work, went to 5, still didn;t work, and now 6 is doing the job. No rush on new script, but will eventually new script for this amount. Pt would also like to have his thyroid checked.  Pt states he has been trying to diet, and in fact gained weight. (4 lbs in 2 days) Pls advise.

## 2012-09-02 MED ORDER — PRAMIPEXOLE DIHYDROCHLORIDE 1 MG PO TABS: 3.0000 mg | ORAL_TABLET | Freq: Every day | ORAL | Status: DC

## 2012-09-02 NOTE — Telephone Encounter (Signed)
Pt aware and appt set/kh 

## 2012-09-02 NOTE — Telephone Encounter (Signed)
I sent new script to pharmacy, can you call pt to schedule the lab draw?

## 2012-09-02 NOTE — Telephone Encounter (Signed)
Change his rx for Mirapex to 1 mg tablets to take 3 qhs, call in #[phone] with 5 rf. I ordered a TSH so he can make a lab appt

## 2012-09-05 ENCOUNTER — Other Ambulatory Visit (INDEPENDENT_AMBULATORY_CARE_PROVIDER_SITE_OTHER): Payer: PRIVATE HEALTH INSURANCE

## 2012-09-05 DIAGNOSIS — R635 Abnormal weight gain: Secondary | ICD-10-CM

## 2012-09-06 LAB — T3, FREE: T3, Free: 2.5 pg/mL (ref 2.3–4.2)

## 2012-09-06 LAB — TSH: TSH: 0.54 u[IU]/mL (ref 0.35–5.50)

## 2012-09-07 NOTE — Progress Notes (Signed)
Quick Note:  I released results in my chart. ______ 

## 2012-11-28 ENCOUNTER — Telehealth: Payer: Self-pay | Admitting: Family Medicine

## 2012-11-28 MED ORDER — GLUCOSE BLOOD VI STRP: ORAL_STRIP | Status: DC

## 2012-11-28 NOTE — Telephone Encounter (Signed)
Refill request for One Touch Ultra stips blue or One Touch verio gold strips and send to [facility].

## 2012-11-28 NOTE — Telephone Encounter (Signed)
I sent script e-scribe. 

## 2012-12-02 ENCOUNTER — Telehealth: Payer: Self-pay | Admitting: Family Medicine

## 2012-12-02 MED ORDER — PRAMIPEXOLE DIHYDROCHLORIDE 1 MG PO TABS: 3.0000 mg | ORAL_TABLET | Freq: Every day | ORAL | Status: DC

## 2012-12-02 NOTE — Telephone Encounter (Signed)
Pt requested a 90 day supply of Pramipexole HCL 1 mg and send to [facility], which I did send e-scribe.

## 2012-12-05 ENCOUNTER — Telehealth: Payer: Self-pay | Admitting: Family Medicine

## 2012-12-05 NOTE — Telephone Encounter (Signed)
PT states that his mail order pharmacy has sent a free box of glucose blood (ONE TOUCH ULTRA TEST) test strip to [facility] on [address]. But they will need an order from [place] as "50 for 45 days supply". Please assist.

## 2012-12-06 NOTE — Telephone Encounter (Signed)
I called in script for test strips to [facility].

## 2012-12-09 ENCOUNTER — Telehealth: Payer: Self-pay | Admitting: Family Medicine

## 2012-12-09 MED ORDER — GLUCOSE BLOOD VI STRP: ORAL_STRIP | Status: DC

## 2012-12-09 NOTE — Telephone Encounter (Signed)
Nettie Elm, I had a voice mail from this patient. 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 He needs the Truetrack strips. A prior auth on these strips has been approved as of 12/02/12. Can you send in the Truetrack strips? Thanks.

## 2012-12-09 NOTE — Telephone Encounter (Signed)
I sent script e-scribe and left voice message for pt 

## 2012-12-12 ENCOUNTER — Telehealth: Payer: Self-pay | Admitting: Family Medicine

## 2012-12-12 NOTE — Telephone Encounter (Signed)
Pt calling back. States that [facility] will give him one free box of [facility] strips at [facility]. Rx must say: "50 for 45 day supply". If you look at phone note from 8/4, it is the same. However that note incorrectly said One Touch. Please send 1-time only rx for TrueTrack to [facility] [address] "50 for 45 day supply".   KEEP the regular, long standing TrueTrack strip rx via mail order. Don't cancel.

## 2012-12-12 NOTE — Telephone Encounter (Signed)
I called pharmacy and did speak with pharmacy, gave the below order for strips.

## 2012-12-14 ENCOUNTER — Telehealth: Payer: Self-pay | Admitting: Family Medicine

## 2012-12-14 NOTE — Telephone Encounter (Signed)
I called this in [facility] to [facility] and I spoke with pt.

## 2012-12-14 NOTE — Telephone Encounter (Signed)
Pt states the script for glucose blood (TRUETRACK TEST) test strip Needs to be written a certain way in order for it to be "Free".  Rx must say: "50 for 45 day supply". [facility]. Pt states if you need to speak w/ him, pls call.

## 2013-01-24 ENCOUNTER — Telehealth: Payer: Self-pay | Admitting: Family Medicine

## 2013-01-24 NOTE — Telephone Encounter (Signed)
Refill request for Doxycycline 100 mg qd, Fenofibrate 145 mg qd, Synthroid 200 mcg qd, Lisinopril 10 mg qd and send to [facility]. Can we refill these?

## 2013-01-25 MED ORDER — LISINOPRIL 10 MG PO TABS: 10.0000 mg | ORAL_TABLET | Freq: Every day | ORAL | Status: DC

## 2013-01-25 MED ORDER — LEVOTHYROXINE SODIUM 200 MCG PO TABS: 200.0000 ug | ORAL_TABLET | Freq: Every day | ORAL | Status: DC

## 2013-01-25 MED ORDER — DOXYCYCLINE HYCLATE 100 MG PO CAPS: 100.0000 mg | ORAL_CAPSULE | Freq: Every day | ORAL | Status: DC

## 2013-01-25 MED ORDER — FENOFIBRATE 145 MG PO TABS: 145.0000 mg | ORAL_TABLET | Freq: Every day | ORAL | Status: DC

## 2013-01-25 NOTE — Telephone Encounter (Signed)
Refill them all for one year

## 2013-01-25 NOTE — Telephone Encounter (Signed)
I sent all 4 scripts e-scribe. 

## 2013-01-30 ENCOUNTER — Other Ambulatory Visit: Payer: Self-pay | Admitting: Family Medicine

## 2013-01-30 NOTE — Telephone Encounter (Signed)
I spoke with mail order and pt, the order shipped out on 01/26/13.

## 2013-01-30 NOTE — Telephone Encounter (Signed)
Nettie Elm please re- [facility].

## 2013-03-09 ENCOUNTER — Other Ambulatory Visit: Payer: Self-pay

## 2013-03-16 ENCOUNTER — Telehealth: Payer: Self-pay

## 2013-03-16 NOTE — Telephone Encounter (Signed)
CALLED PT LEFT VM IN REF TO NP APPT. °

## 2013-03-17 ENCOUNTER — Telehealth: Payer: Self-pay | Admitting: Internal Medicine

## 2013-03-17 NOTE — Telephone Encounter (Signed)
C/D 03/17/13 for appt. 03/22/13

## 2013-03-22 ENCOUNTER — Ambulatory Visit (HOSPITAL_BASED_OUTPATIENT_CLINIC_OR_DEPARTMENT_OTHER): Payer: BC Managed Care – PPO | Admitting: Internal Medicine

## 2013-03-22 ENCOUNTER — Ambulatory Visit: Payer: BC Managed Care – PPO

## 2013-03-22 ENCOUNTER — Telehealth: Payer: Self-pay | Admitting: Internal Medicine

## 2013-03-22 ENCOUNTER — Encounter: Payer: Self-pay | Admitting: Internal Medicine

## 2013-03-22 ENCOUNTER — Ambulatory Visit (HOSPITAL_BASED_OUTPATIENT_CLINIC_OR_DEPARTMENT_OTHER): Payer: BC Managed Care – PPO | Admitting: Lab

## 2013-03-22 VITALS — BP 130/75 | HR 86 | Temp 98.0°F | Wt 269.0 lb

## 2013-03-22 DIAGNOSIS — D696 Thrombocytopenia, unspecified: Secondary | ICD-10-CM

## 2013-03-22 DIAGNOSIS — E669 Obesity, unspecified: Secondary | ICD-10-CM

## 2013-03-22 LAB — COMPREHENSIVE METABOLIC PANEL (CC13)
ALT: 31 U/L (ref 0–55)
AST: 20 U/L (ref 5–34)
Albumin: 4.6 g/dL (ref 3.5–5.0)
Alkaline Phosphatase: 43 U/L (ref 40–150)
Anion Gap: 11 meq/L (ref 3–11)
BUN: 23.2 mg/dL (ref 7.0–26.0)
CO2: 25 meq/L (ref 22–29)
Calcium: 10.5 mg/dL — ABNORMAL HIGH (ref 8.4–10.4)
Chloride: 103 meq/L (ref 98–109)
Creatinine: 1.3 mg/dL (ref 0.7–1.3)
Glucose: 106 mg/dL (ref 70–140)
Potassium: 4.2 meq/L (ref 3.5–5.1)
Sodium: 139 meq/L (ref 136–145)
Total Bilirubin: 0.42 mg/dL (ref 0.20–1.20)
Total Protein: 7.6 g/dL (ref 6.4–8.3)

## 2013-03-22 LAB — CBC WITH DIFFERENTIAL/PLATELET
BASO%: 0.8 % (ref 0.0–2.0)
Basophils Absolute: 0 10e3/uL (ref 0.0–0.1)
EOS%: 2.1 % (ref 0.0–7.0)
Eosinophils Absolute: 0.1 10e3/uL (ref 0.0–0.5)
HCT: 40.5 % (ref 38.4–49.9)
HGB: 13.7 g/dL (ref 13.0–17.1)
LYMPH%: 30.8 % (ref 14.0–49.0)
MCH: 31.6 pg (ref 27.2–33.4)
MCHC: 33.9 g/dL (ref 32.0–36.0)
MCV: 93.1 fL (ref 79.3–98.0)
MONO#: 0.5 10e3/uL (ref 0.1–0.9)
MONO%: 9.3 % (ref 0.0–14.0)
NEUT#: 3 10e3/uL (ref 1.5–6.5)
NEUT%: 57 % (ref 39.0–75.0)
Platelets: 106 10e3/uL — ABNORMAL LOW (ref 140–400)
RBC: 4.35 10e6/uL (ref 4.20–5.82)
RDW: 13.4 % (ref 11.0–14.6)
WBC: 5.2 10e3/uL (ref 4.0–10.3)
lymph#: 1.6 10e3/uL (ref 0.9–3.3)

## 2013-03-22 LAB — CHCC SMEAR

## 2013-03-22 NOTE — Patient Instructions (Signed)
Thrombocytopenia Thrombocytopenia is a condition in which there is an abnormally small number of platelets in your blood. Platelets are also called thrombocytes. Platelets are needed for blood clotting. CAUSES Thrombocytopenia is caused by:   Decreased production of platelets. This can be caused by:  Aplastic anemia in which your bone marrow quits making blood cells.  Cancer in the bone marrow.  Use of certain medicines, including chemotherapy.  Infection in the bone marrow.  Heavy alcohol consumption.  Increased destruction of platelets. This can be caused by:  Certain immune diseases.  Use of certain drugs.  Certain blood clotting disorders.  Certain inherited disorders.  Certain bleeding disorders.  Pregnancy.  Having an enlarged spleen (hypersplenism). In hypersplenism, the spleen gathers up platelets from circulation. This means the platelets are not available to help with blood clotting. The spleen can enlarge due to cirrhosis or other conditions. SYMPTOMS  The symptoms of thrombocytopenia are side effects of poor blood clotting. Some of these are:  Abnormal bleeding.  Nosebleeds.  Heavy menstrual periods.  Blood in the urine or stools.  Purpura. This is a purplish discoloration in the skin produced by small bleeding vessels near the surface of the skin.  Bruising.  A rash that may be petechial. This looks like pinpoint, purplish-red spots on the skin and mucous membranes. It is caused by bleeding from small blood vessels (capillaries). DIAGNOSIS  Your caregiver will make this diagnosis based on your exam and blood tests. Sometimes, a bone marrow study is done to look for the original cells (megakaryocytes) that make platelets. TREATMENT  Treatment depends on the cause of the condition.  Medicines may be given to help protect your platelets from being destroyed.  In some cases, a replacement (transfusion) of platelets may be required to stop or prevent  bleeding.  Sometimes, the spleen must be surgically removed. HOME CARE INSTRUCTIONS   Check the skin and linings inside your mouth for bruising or bleeding as directed by your caregiver.  Check your sputum, urine, and stool for blood as directed by your caregiver.  Do not return to any activities that could cause bumps or bruises until your caregiver says it is okay.  Take extra care not to cut yourself when shaving or when using scissors, needles, knives, and other tools.  Take extra care not to burn yourself when ironing or cooking.  Ask your caregiver if it is okay for you to drink alcohol.  Only take over-the-counter or prescription medicines as directed by your caregiver.  Notify all your caregivers, including dentists and eye doctors, about your condition. SEEK IMMEDIATE MEDICAL CARE IF:   You develop active bleeding from anywhere in your body.  You develop unexplained bruising or bleeding.  You have blood in your sputum, urine, or stool. MAKE SURE YOU:  Understand these instructions.  Will watch your condition.  Will get help right away if you are not doing well or get worse. Document Released: 04/20/2005 Document Revised: 07/13/2011 Document Reviewed: 02/20/2011 Exit[employer] Patient Information 2014 [employer].  

## 2013-03-22 NOTE — Progress Notes (Signed)
Checked in new patient with no financial issues. He has not been to [place]. °

## 2013-03-22 NOTE — Telephone Encounter (Signed)
We dicussed that his plts were 102 slightly improved since last drawn by his PCP.  Patient understands to repeat his plts in 2 weeks.

## 2013-03-22 NOTE — Telephone Encounter (Signed)
gv and printed appt sched and avs forpt for NOV and Dec....sent pt to lab

## 2013-03-23 DIAGNOSIS — E669 Obesity, unspecified: Secondary | ICD-10-CM | POA: Insufficient documentation

## 2013-03-23 NOTE — Progress Notes (Signed)
[facility] Telephone:([phone]) [phone]   [phone]  NEW PATIENT EVALUATION   Name: Edward Olsen Date: 03/23/2013 MRN: [id number] DOB: [birth year]  PCP: Nelwyn Salisbury, MD   REFERRING PHYSICIAN: Nelwyn Salisbury, MD  REASON FOR REFERRAL:  Thrombocytopenia    HISTORY OF PRESENT ILLNESS:Edward Olsen is a 57 y.o. male who is moderately obese active gentlemen with a history hypothyroidism, rosacea on doxycyline presents for further evaluation of his thrombocytopenia.  He is also being followed by his endocrinologist Dr. Talmage Nap.  He reports that he was told that he had low plts on 10/29 with CBC demonstrating 4.7 WBCs, 13.6 g/dL Hemoglobin, 14N Platelets.   He reports that they were reported on 11/10 with a WBC of 4.9, hemoglobin of 13.3 and platelet count of 88.  He denies any bleeding problems such as hemoptysis, epitaxis, hematochezia or hematuria.   He does endorse easy bruising, i.e., he dropped a bar of soap on his foot and it left a bruise.  He denies a history of low platelets.  He reports that he has been on doxycycline 100 mg once daily for rosacea over the past three years.  He denies any viral prodromes or illnesses.  He does report seeing a cardiologist yesterday secondary to worsening lower extremity edema and he was started of lasix 40 mg daily.   He reports alcohol use every other day.  He report usually drinks 2-3 shots of vodka or bourbon.  He reports he has decreased the amount he has drinks overall.  He denies a history of hepatitis or liver disease.  He denies jaundice or pruritus.  He reports daytime somnolence with loud snoring.  He reports that he was going have a sleep study but his plt decrease is his main issue now.  He has decreased hearing and requires hearing aides bilaterally.  He reports some dyspnea on exertion.  He denies cough or fever or acute shortness of breath.  He is working full-time as a Therapist, occupational which requires walking  daily.   PAST MEDICAL HISTORY:  has a past medical history of Diabetes mellitus; Thyroid disease; Hyperlipidemia; Hypertension; Rosacea; Hemorrhoids; and Restless leg syndrome.     PAST SURGICAL HISTORY: Past Surgical History  Procedure Laterality Date  . Hemorrhoid surgery  2012  . Colonoscopy  10-04-06    per Dr. Christella Hartigan, polyps, repeat in 5 yrs   . Flexible sigmoidoscopy  07-08-10    per Dr. Christella Hartigan, internal hemorrhoids   . Septoplasty  2013     CURRENT MEDICATIONS: has a current medication list which includes the following prescription(s): vitamin c, aspirin, [employer] truetrack glucose sys, crestor, doxycycline, fenofibrate, fish oil, furosemide, glucose blood, levothyroxine, lisinopril, pramipexole, and vitamin e.   ALLERGIES: Review of patient's allergies indicates no known allergies.   SOCIAL HISTORY:  reports that he quit smoking about 27 years ago. His smoking use included Cigars. He has never used smokeless tobacco. He reports that he drinks about 7.0 ounces of alcohol per week. He reports that he does not use illicit drugs.   FAMILY HISTORY: family history includes Cancer in his mother; Diabetes in his mother; Heart disease in his father. There is no history of Colon cancer.   LABORATORY DATA:  CBC    Component Value Date/Time   WBC 5.2 03/22/2013 0947   WBC 3.4* 12/22/2011 0852   RBC 4.35 03/22/2013 0947   RBC 4.28 12/22/2011 0852   HGB 13.7 03/22/2013 0947   HGB 13.9  12/22/2011 0852   HCT 40.5 03/22/2013 0947   HCT 41.1 12/22/2011 0852   PLT 106 Large platelets present* 03/22/2013 0947   PLT 159.0 12/22/2011 0852   MCV 93.1 03/22/2013 0947   MCV 96.2 12/22/2011 0852   MCH 31.6 03/22/2013 0947   MCHC 33.9 03/22/2013 0947   MCHC 33.9 12/22/2011 0852   RDW 13.4 03/22/2013 0947   RDW 13.0 12/22/2011 0852   LYMPHSABS 1.6 03/22/2013 0947   LYMPHSABS 1.3 12/22/2011 0852   MONOABS 0.5 03/22/2013 0947   MONOABS 0.3 12/22/2011 0852   EOSABS 0.1 03/22/2013 0947   EOSABS 0.1  12/22/2011 0852   BASOSABS 0.0 03/22/2013 0947   BASOSABS 0.0 12/22/2011 0852   CMP     Component Value Date/Time   NA 139 03/22/2013 0948   NA 142 12/22/2011 0852   K 4.2 03/22/2013 0948   K 4.5 12/22/2011 0852   CL 109 12/22/2011 0852   CO2 25 03/22/2013 0948   CO2 27 12/22/2011 0852   GLUCOSE 106 03/22/2013 0948   GLUCOSE 117* 12/22/2011 0852   BUN 23.2 03/22/2013 0948   BUN 20 12/22/2011 0852   CREATININE 1.3 03/22/2013 0948   CREATININE 1.1 12/22/2011 0852   CALCIUM 10.5* 03/22/2013 0948   CALCIUM 9.8 12/22/2011 0852   PROT 7.6 03/22/2013 0948   PROT 6.8 12/22/2011 0852   ALBUMIN 4.6 03/22/2013 0948   ALBUMIN 4.3 12/22/2011 0852   AST 20 03/22/2013 0948   AST 26 12/22/2011 0852   ALT 31 03/22/2013 0948   ALT 35 12/22/2011 0852   ALKPHOS 43 03/22/2013 0948   ALKPHOS 30* 12/22/2011 0852   BILITOT 0.42 03/22/2013 0948   BILITOT 0.7 12/22/2011 0852   GFRNONAA 65.14 12/17/2009 0808   GFRAA 101 09/20/2007 0820    RADIOGRAPHY: No results found.     REVIEW OF SYSTEMS:  Constitutional: Denies fevers, chills or abnormal weight loss Eyes: Denies blurriness of vision Ears, nose, mouth, throat, and face: Denies mucositis or sore throat Respiratory: Denies cough, dyspnea or wheezes Cardiovascular: Denies palpitation, chest discomfort or lower extremity swelling Gastrointestinal:  Denies nausea, heartburn or change in bowel habits Skin: Denies abnormal skin rashes Lymphatics: Denies new lymphadenopathy or easy bruising Neurological:Denies numbness, tingling or new weaknesses Behavioral/Psych: Mood is stable, no new changes  All other systems were reviewed with the patient and are negative.  PHYSICAL EXAM:  weight is 269 lb (122.018 kg). His oral temperature is 98 F (36.7 C). His blood pressure is 130/75 and his pulse is 86.    GENERAL:alert, no distress and comfortable; moderately obese.  Flushed face.   SKIN: skin color, texture, turgor are normal, no rashes or significant  lesions EYES: normal, Conjunctiva are pink and non-injected, sclera clear OROPHARYNX:no exudate, no erythema and lips, buccal mucosa, and tongue normal  NECK: supple, thyroid normal size, non-tender, without nodularity LYMPH:  no palpable lymphadenopathy in the cervical, axillary or inguinal LUNGS: clear to auscultation and percussion with normal breathing effort HEART: regular rate & rhythm and no murmurs and no lower extremity edema ABDOMEN:abdomen soft, non-tender and normal bowel sounds; morbidly obese.  Difficult to palpate spleen size.  No stigmata of liver disease.  Musculoskeletal:no cyanosis of digits and no clubbing  NEURO: alert & oriented x 3 with fluent speech, no focal motor/sensory deficits   IMPRESSION: Edward Olsen is a 57 y.o. male with a history of Obesity, hypothyroidism, alcohol use, rosacea on doxycyline presents with thrombocytopenia of unclear etiology.   Thrombocytopenia differential includes:  Viral induced versus Drug or alcohol-induced (Doxycline as well as alcohol cause low plts but he has been on these for some time) vs splenomegaly/liver disease (less likely because liver enzymes are within normal limits) vs Immune mediate (diagnosis of exclusion but does have large plts).   He does not have HIV risk factors (He is monogamous and non-drug user).   PLAN: 1.  Thrombocytopenia NOS.   -- Today, his plts are above 100 thousand so he is not at an increased risk of bleeding.  We will repeat his labs in a couple weeks to establish trend.  We obtained a peripheral smear which revealed a few large plts without schistocytes. He appears to have isolated thrombocytopenia making a primary bone marrow problem less likely.   If plts resume downward trend, he will need to hold all  thrombocytopenia-inducing medication including doxycycline and restrict alcohol intake.  We will also obtain an abdominal u/s to rule splenomegaly. If splenomegaly and without a clear etiology, we will  consider a bone marrow biopsy.  He was provided handouts on low plts and instructed to follow up sooner if he has any bleeding.  He is scheduled to follow up with repeat CBC and CMP in one month.   All questions were answered. The patient knows to call the clinic with any problems, questions or concerns. We can certainly see the patient much sooner if necessary.  I spent 25 minutes counseling the patient face to face. The total time spent in the appointment was 45 minutes.    Chanze Teagle, MD 03/23/2013 9:18 AM

## 2013-03-29 ENCOUNTER — Other Ambulatory Visit: Payer: Self-pay | Admitting: Internal Medicine

## 2013-03-29 ENCOUNTER — Ambulatory Visit (HOSPITAL_COMMUNITY)
Admission: RE | Admit: 2013-03-29 | Discharge: 2013-03-29 | Disposition: A | Discharge status: Home / Self Care | Payer: BC Managed Care – PPO | Source: Ambulatory Visit | Attending: Internal Medicine | Admitting: Internal Medicine

## 2013-03-29 DIAGNOSIS — D696 Thrombocytopenia, unspecified: Secondary | ICD-10-CM

## 2013-03-29 DIAGNOSIS — R1012 Left upper quadrant pain: Secondary | ICD-10-CM | POA: Insufficient documentation

## 2013-04-05 ENCOUNTER — Other Ambulatory Visit (HOSPITAL_BASED_OUTPATIENT_CLINIC_OR_DEPARTMENT_OTHER): Payer: BC Managed Care – PPO

## 2013-04-05 DIAGNOSIS — D696 Thrombocytopenia, unspecified: Secondary | ICD-10-CM

## 2013-04-05 LAB — CBC WITH DIFFERENTIAL/PLATELET
Basophils Absolute: 0 10*3/uL (ref 0.0–0.1)
EOS%: 2.5 % (ref 0.0–7.0)
Eosinophils Absolute: 0.1 10*3/uL (ref 0.0–0.5)
HCT: 39.5 % (ref 38.4–49.9)
HGB: 13.4 g/dL (ref 13.0–17.1)
MCH: 31.7 pg (ref 27.2–33.4)
MCV: 93.3 fL (ref 79.3–98.0)
MONO%: 11.2 % (ref 0.0–14.0)
NEUT%: 59 % (ref 39.0–75.0)
RBC: 4.23 10*6/uL (ref 4.20–5.82)
WBC: 5.6 10*3/uL (ref 4.0–10.3)
lymph#: 1.5 10*3/uL (ref 0.9–3.3)

## 2013-04-07 ENCOUNTER — Telehealth: Payer: Self-pay | Admitting: Medical Oncology

## 2013-04-07 NOTE — Telephone Encounter (Signed)
Pt called for his CBC results. I gave him the results and he is aware of his next appt 04/20/13. I asked him to call if any problems.

## 2013-04-20 ENCOUNTER — Encounter: Payer: Self-pay | Admitting: Internal Medicine

## 2013-04-20 ENCOUNTER — Ambulatory Visit (HOSPITAL_BASED_OUTPATIENT_CLINIC_OR_DEPARTMENT_OTHER): Payer: BC Managed Care – PPO | Admitting: Internal Medicine

## 2013-04-20 ENCOUNTER — Other Ambulatory Visit (HOSPITAL_BASED_OUTPATIENT_CLINIC_OR_DEPARTMENT_OTHER): Payer: BC Managed Care – PPO

## 2013-04-20 VITALS — BP 131/81 | HR 84 | Temp 98.9°F | Resp 18 | Ht 72.0 in | Wt 268.3 lb

## 2013-04-20 DIAGNOSIS — D696 Thrombocytopenia, unspecified: Secondary | ICD-10-CM

## 2013-04-20 LAB — CBC WITH DIFFERENTIAL/PLATELET
BASO%: 0.8 % (ref 0.0–2.0)
Eosinophils Absolute: 0.1 10*3/uL (ref 0.0–0.5)
HGB: 13.6 g/dL (ref 13.0–17.1)
LYMPH%: 30.3 % (ref 14.0–49.0)
MCHC: 34.3 g/dL (ref 32.0–36.0)
MCV: 93.4 fL (ref 79.3–98.0)
MONO#: 0.6 10*3/uL (ref 0.1–0.9)
MONO%: 10.7 % (ref 0.0–14.0)
NEUT#: 3.2 10*3/uL (ref 1.5–6.5)
RBC: 4.24 10*6/uL (ref 4.20–5.82)
RDW: 13.3 % (ref 11.0–14.6)
WBC: 5.8 10*3/uL (ref 4.0–10.3)
lymph#: 1.8 10*3/uL (ref 0.9–3.3)

## 2013-04-20 NOTE — Progress Notes (Signed)
[facility] OFFICE PROGRESS NOTE  Edward Salisbury, MD [address] [place] [address]  DIAGNOSIS: Thrombocytopenia  Chief Complaint  Patient presents with  . Thrombocytopenia, unspecified    CURRENT THERAPY: Observation  INTERVAL HISTORY: READ BONELLI 57 y.o. male with a history of thrombocytopenia is here for follow up.  He was initially seen by me on 03/22/2013.  His other history includes moderately obesity, hypothyroidism, rosacea on doxycyline.   He is also being followed by his endocrinologist Dr. Talmage Nap. He reported that he was told that he had low plts on 10/29 with CBC demonstrating 4.7 WBCs, 13.6 g/dL Hemoglobin, 60A Platelets. He reported that they were reported on 11/10 with a WBC of 4.9, hemoglobin of 13.3 and platelet count of 88. He denies any bleeding problems such as hemoptysis, epitaxis, hematochezia or hematuria. He endorsed easy bruising, i.e., he dropped a bar of soap on his foot and it left a bruise. He denied a history of low platelets. He reported that he has been on doxycycline 100 mg once daily for rosacea over the past three years. He denied any viral prodromes or illnesses. He reported seeing a cardiologist yesterday secondary to worsening lower extremity edema and he was started of lasix 40 mg daily. He reported alcohol use every other day. He reported usually drinks 2-3 shots of vodka or bourbon. He reported he has decreased the amount he has drinks overall. He denies a history of hepatitis or liver disease. He denies jaundice or pruritus. He reported daytime somnolence with loud snoring. He reported that he was going have a sleep study but his plt decrease is his main issue now. He has decreased hearing and requires hearing aides bilaterally. He reported some dyspnea on exertion. He denied cough or fever or acute shortness of breath. He is working full-time as a Therapist, occupational which requires walking daily.   Today, he  reports doing well overall.  He denies any bleeding episodes.    MEDICAL HISTORY: Past Medical History  Diagnosis Date  . Diabetes mellitus     diet controlled  . Thyroid disease   . Hyperlipidemia   . Hypertension   . Rosacea   . Hemorrhoids   . Restless leg syndrome     INTERIM HISTORY: has HYPOTHYROIDISM; DIABETES MELLITUS, TYPE II; HYPERLIPIDEMIA; GOUT; HYPERTENSION; DEVIATED NASAL SEPTUM; ALLERGIC RHINITIS; HEMORRHAGE OF RECTUM AND ANUS; Hemorrhoid prolapse; Restless leg syndrome; and Obesity (BMI 30.0-34.9) on his problem list.    ALLERGIES:  has No Known Allergies.  MEDICATIONS: has a current medication list which includes the following prescription(s): vitamin c, aspirin, [employer] truetrack glucose sys, crestor, doxycycline, fenofibrate, fish oil, furosemide, glucose blood, levothyroxine, lisinopril, pramipexole, and vitamin e.  SURGICAL HISTORY:  Past Surgical History  Procedure Laterality Date  . Hemorrhoid surgery  2012  . Colonoscopy  10-04-06    per Dr. Christella Hartigan, polyps, repeat in 5 yrs   . Flexible sigmoidoscopy  07-08-10    per Dr. Christella Hartigan, internal hemorrhoids   . Septoplasty  2013    REVIEW OF SYSTEMS:   Constitutional: Denies fevers, chills or abnormal weight loss Eyes: Denies blurriness of vision Ears, nose, mouth, throat, and face: Denies mucositis or sore throat Respiratory: Denies cough, dyspnea or wheezes Cardiovascular: Denies palpitation, chest discomfort or lower extremity swelling Gastrointestinal:  Denies nausea, heartburn or change in bowel habits Skin: Denies abnormal skin rashes Lymphatics: Denies new lymphadenopathy or easy bruising Neurological:Denies numbness, tingling or new weaknesses Behavioral/Psych: Mood is stable, no  new changes  All other systems were reviewed with the patient and are negative.  PHYSICAL EXAMINATION: ECOG PERFORMANCE STATUS: 0 - Asymptomatic  Blood pressure 131/81, pulse 84, temperature 98.9 F (37.2 C), temperature  source Oral, resp. rate 18, height 6' (1.829 m), weight 268 lb 4.8 oz (121.7 kg).  GENERAL:alert, no distress and comfortable; ruddy complexion. Moderately obese.  SKIN: skin color, texture, turgor are normal, no rashes or significant lesions EYES: normal, Conjunctiva are pink and non-injected, sclera clear OROPHARYNX:no exudate, no erythema and lips, buccal mucosa, and tongue normal  NECK: supple, thyroid normal size, non-tender, without nodularity LYMPH:  no palpable lymphadenopathy in the cervical, axillary or supraclavicular LUNGS: clear to auscultation and percussion with normal breathing effort HEART: regular rate & rhythm and no murmurs and no lower extremity edema ABDOMEN:abdomen soft, non-tender and normal bowel sounds Musculoskeletal:no cyanosis of digits and no clubbing  NEURO: alert & oriented x 3 with fluent speech, no focal motor/sensory deficits   LABORATORY DATA: Results for orders placed in visit on 04/20/13 (from the past 48 hour(s))  CBC WITH DIFFERENTIAL     Status: None   Collection Time    04/20/13 10:09 AM      Result Value Range   WBC 5.8  4.0 - 10.3 10e3/uL   NEUT# 3.2  1.5 - 6.5 10e3/uL   HGB 13.6  13.0 - 17.1 g/dL   HCT 16.1  09.6 - 04.5 %   Platelets 159  140 - 400 10e3/uL   MCV 93.4  79.3 - 98.0 fL   MCH 32.0  27.2 - 33.4 pg   MCHC 34.3  32.0 - 36.0 g/dL   RBC 4.09  8.11 - 9.14 10e6/uL   RDW 13.3  11.0 - 14.6 %   lymph# 1.8  0.9 - 3.3 10e3/uL   MONO# 0.6  0.1 - 0.9 10e3/uL   Eosinophils Absolute 0.1  0.0 - 0.5 10e3/uL   Basophils Absolute 0.0  0.0 - 0.1 10e3/uL   NEUT% 55.7  39.0 - 75.0 %   LYMPH% 30.3  14.0 - 49.0 %   MONO% 10.7  0.0 - 14.0 %   EOS% 2.5  0.0 - 7.0 %   BASO% 0.8  0.0 - 2.0 %      Labs:  Lab Results  Component Value Date   WBC 5.8 04/20/2013   HGB 13.6 04/20/2013   HCT 39.6 04/20/2013   MCV 93.4 04/20/2013   PLT 159 04/20/2013   NEUTROABS 3.2 04/20/2013      Chemistry      Component Value Date/Time   NA 139 03/22/2013  0948   NA 142 12/22/2011 0852   K 4.2 03/22/2013 0948   K 4.5 12/22/2011 0852   CL 109 12/22/2011 0852   CO2 25 03/22/2013 0948   CO2 27 12/22/2011 0852   BUN 23.2 03/22/2013 0948   BUN 20 12/22/2011 0852   CREATININE 1.3 03/22/2013 0948   CREATININE 1.1 12/22/2011 0852      Component Value Date/Time   CALCIUM 10.5* 03/22/2013 0948   CALCIUM 9.8 12/22/2011 0852   ALKPHOS 43 03/22/2013 0948   ALKPHOS 30* 12/22/2011 0852   AST 20 03/22/2013 0948   AST 26 12/22/2011 0852   ALT 31 03/22/2013 0948   ALT 35 12/22/2011 0852   BILITOT 0.42 03/22/2013 0948   BILITOT 0.7 12/22/2011 0852     CBC:  Recent Labs Lab 04/20/13 1009  WBC 5.8  NEUTROABS 3.2  HGB 13.6  HCT 39.6  MCV 93.4  PLT 159   Results for JERIMY, JOHANSON (MRN [id number]) as of 04/20/2013 10:43  Ref. Range 03/22/2013 09:47 04/05/2013 09:58 04/20/2013 10:09  Platelets Latest Range: 150.0-400.0 K/uL 106 Large platelets present (L) 137 (L) 159   RADIOGRAPHIC STUDIES: US Abdomen Limited  03/29/2013   CLINICAL DATA:  Thrombocytopenia, left upper quadrant pain  EXAM: US ABDOMEN LIMITED  COMPARISON:  None.  FINDINGS: Spleen  At the upper limits of normal for size, measuring 11.6 x 12.6 x 5.4 cm (calculated volume 415 mL).  No mass or sonographic abnormality is seen.  IMPRESSION: Spleen is at the upper limits of normal for size, measuring 12.6 cm in maximal dimension (calculated volume 415 mL).   Electronically Signed   By: Charline Bills M.D.   On: 03/29/2013 09:00    ASSESSMENT: AMAREE LEEPER 57 y.o. male with a history of Thrombocytopenia   history of Obesity, hypothyroidism, alcohol use, rosacea on doxycyline   Thrombocytopenia differential includes:  Viral induced versus Drug or alcohol-induced (Doxycline as well as alcohol cause low plts but he has been on these for some time) vs splenomegaly/liver disease (less likely because liver enzymes are within normal limits) vs Immune mediate (diagnosis of exclusion but does  have large plts). He does not have HIV risk factors (He is monogamous and non-drug user).   PLAN:  1. Thrombocytopenia NOS, resolved.  -- Today, his plts are 159.  He appears to have had isolated thrombocytopenia making a primary bone marrow problem less likely.  Abdominal u/s to rule splenomegaly was done as noted above. He was advised to return for follow-up based on thrombocytopenia or resulting bleeding.   2. OSA, moderate. --He started bipap yesterday. Hope to see improvement in his daytime somnolence.   3. DM2. --He is followed by endocrinology.  He will start metformin.   4. Hypertension.  --He will continue his lisinopril.  5. Hypercholesteremia. --He will continue his statin per PCP.   6. Rosacea --Continue doxycycline.    7. Follow-up. --He was instructed to follow-up on an as needed basis.   All questions were answered. The patient knows to call the clinic with any problems, questions or concerns. We can certainly see the patient much sooner if necessary.  I spent 10 minutes counseling the patient face to face. The total time spent in the appointment was 15 minutes.    Yasmin Bronaugh, MD 04/20/2013 10:46 AM

## 2013-05-02 ENCOUNTER — Other Ambulatory Visit: Payer: Self-pay | Admitting: Family Medicine

## 2013-05-02 NOTE — Telephone Encounter (Signed)
Looks like pt needs labs, can we refill this?

## 2013-05-04 DIAGNOSIS — IMO0001 Reserved for inherently not codable concepts without codable children: Secondary | ICD-10-CM

## 2013-05-04 HISTORY — DX: Reserved for inherently not codable concepts without codable children: IMO0001

## 2013-05-05 NOTE — Telephone Encounter (Signed)
Refill for one year 

## 2013-05-17 ENCOUNTER — Other Ambulatory Visit: Payer: Self-pay | Admitting: Family Medicine

## 2013-06-04 DIAGNOSIS — B029 Zoster without complications: Secondary | ICD-10-CM

## 2013-06-04 HISTORY — DX: Zoster without complications: B02.9

## 2013-06-10 ENCOUNTER — Other Ambulatory Visit: Payer: Self-pay | Admitting: Family Medicine

## 2013-06-27 ENCOUNTER — Encounter: Payer: Self-pay | Admitting: Family Medicine

## 2013-06-27 ENCOUNTER — Ambulatory Visit (INDEPENDENT_AMBULATORY_CARE_PROVIDER_SITE_OTHER): Payer: BC Managed Care – PPO | Admitting: Family Medicine

## 2013-06-27 VITALS — BP 122/78 | HR 89 | Temp 98.3°F | Ht 72.0 in | Wt 281.0 lb

## 2013-06-27 DIAGNOSIS — E785 Hyperlipidemia, unspecified: Secondary | ICD-10-CM

## 2013-06-27 DIAGNOSIS — D696 Thrombocytopenia, unspecified: Secondary | ICD-10-CM

## 2013-06-27 DIAGNOSIS — I10 Essential (primary) hypertension: Secondary | ICD-10-CM

## 2013-06-27 DIAGNOSIS — G2581 Restless legs syndrome: Secondary | ICD-10-CM

## 2013-06-27 LAB — LIPID PANEL
Cholesterol: 156 mg/dL (ref 0–200)
HDL: 36.3 mg/dL — AB (ref >39.00)
Total CHOL/HDL Ratio: 4
Triglycerides: 270 mg/dL — ABNORMAL HIGH (ref 0.0–149.0)
VLDL: 54 mg/dL — ABNORMAL HIGH (ref 0.0–40.0)

## 2013-06-27 LAB — HEPATIC FUNCTION PANEL
ALT: 92 U/L — ABNORMAL HIGH (ref 0–53)
AST: 60 U/L — ABNORMAL HIGH (ref 0–37)
Albumin: 4.5 g/dL (ref 3.5–5.2)
Alkaline Phosphatase: 31 U/L — ABNORMAL LOW (ref 39–117)
BILIRUBIN TOTAL: 0.9 mg/dL (ref 0.3–1.2)
Bilirubin, Direct: 0.1 mg/dL (ref 0.0–0.3)
Total Protein: 7.3 g/dL (ref 6.0–8.3)

## 2013-06-27 LAB — BASIC METABOLIC PANEL
BUN: 21 mg/dL (ref 6–23)
CALCIUM: 9.6 mg/dL (ref 8.4–10.5)
CO2: 28 mEq/L (ref 19–32)
Chloride: 99 mEq/L (ref 96–112)
Creatinine, Ser: 1.3 mg/dL (ref 0.4–1.5)
GFR: 60.87 mL/min (ref >60.00)
Glucose, Bld: 101 mg/dL — ABNORMAL HIGH (ref 70–99)
Potassium: 3.9 mEq/L (ref 3.5–5.1)
Sodium: 136 mEq/L (ref 135–145)

## 2013-06-27 LAB — LDL CHOLESTEROL, DIRECT: Direct LDL: 86 mg/dL

## 2013-06-27 MED ORDER — TRAMADOL HCL 50 MG PO TABS: 100.0000 mg | ORAL_TABLET | Freq: Four times a day (QID) | ORAL | Status: DC | PRN

## 2013-06-27 MED ORDER — ROSUVASTATIN CALCIUM 10 MG PO TABS
10.0000 mg | ORAL_TABLET | Freq: Every day | ORAL | Status: DC
Start: 2013-06-27 — End: 2014-06-26

## 2013-06-27 MED ORDER — PRAMIPEXOLE DIHYDROCHLORIDE 1 MG PO TABS: 3.0000 mg | ORAL_TABLET | Freq: Every day | ORAL | Status: DC

## 2013-06-27 MED ORDER — SITAGLIPTIN PHOSPHATE 50 MG PO TABS: 50.0000 mg | ORAL_TABLET | Freq: Every day | ORAL | Status: DC

## 2013-06-27 NOTE — Progress Notes (Signed)
   Subjective:    Patient ID: Edward Olsen, male    DOB: October 28, 1955, 58 y.o.   MRN: [id number]  HPI Here for refills. Since I last saw him he has been through a medical whirlwind. He saw Dr. Juliann Mule for thrombocytopenia, which seems stable. He referred himself to Dr. Chalmers Cater for diabetes, and she started him on Januvia. She diagnosed him with renal insufficiency but his recent creatinine with Dr. Juliann Mule was normal. His BP is stable. Also he developed shingles on the left side of his scalp last weekend and he went to Urgent Care. They started him on Valtrex and Tramadol. He asks for more Tramadol.   Review of Systems  Constitutional: Negative.   Respiratory: Negative.   Cardiovascular: Negative.        Objective:   Physical Exam  Constitutional: He appears well-developed and well-nourished.  Cardiovascular: Normal rate, regular rhythm, normal heart sounds and intact distal pulses.   Pulmonary/Chest: Effort normal and breath sounds normal.          Assessment & Plan:  Get labs including a lipid panel. Check a GFR. Refilled Tramadol to use prn.

## 2013-06-27 NOTE — Progress Notes (Signed)
Pre visit review using our clinic review tool, if applicable. No additional management support is needed unless otherwise documented below in the visit note. 

## 2013-06-28 ENCOUNTER — Telehealth: Payer: Self-pay | Admitting: Family Medicine

## 2013-06-28 NOTE — Telephone Encounter (Signed)
Relevant patient education assigned to patient using Emmi. ° °

## 2013-07-05 ENCOUNTER — Other Ambulatory Visit: Payer: Self-pay | Admitting: Family Medicine

## 2013-07-05 NOTE — Telephone Encounter (Signed)
Can we fill this?

## 2013-08-11 ENCOUNTER — Telehealth: Payer: Self-pay | Admitting: Family Medicine

## 2013-08-11 NOTE — Telephone Encounter (Signed)
Patient Information:  Caller Name: Jewell  Phone: [phone]  Patient: Edward Olsen, Edward Olsen  Gender: Male  DOB: 1955-12-16  Age: 58 Years  PCP: [facility]  Office Follow Up:  Does the office need to follow up with this patient?: No  Instructions For The Office: N/A  RN Note:  Patient calling regarding recurrent lower right sided chest pain with cough.  States "This happened a couple of months ago and I went to UC because I had a bruise on my side from coughing so much."  Cough today and now symptoms are back.  Denies bruising.  Unable to triage, patient states I'm on my way to [place] now.  Symptoms  Reason For Call & Symptoms: right sided rib pain after cough  Reviewed Health History In EMR: Yes  Reviewed Medications In EMR: Yes  Reviewed Allergies In EMR: Yes  Reviewed Surgeries / Procedures: Yes  Date of Onset of Symptoms: 08/11/2013  Guideline(s) Used:  Chest Pain  No Protocol Available - Sick Adult  Disposition Per Guideline:   Home Care  Reason For Disposition Reached:   Patient's symptoms are safe to treat at home per nursing judgment  Advice Given:  N/A  Patient Will Follow Care Advice:  YES

## 2013-08-14 NOTE — Telephone Encounter (Signed)
Looks like FYI

## 2013-08-23 ENCOUNTER — Telehealth: Payer: Self-pay | Admitting: Family Medicine

## 2013-08-23 MED ORDER — GLUCOSE BLOOD VI STRP: ORAL_STRIP | Status: DC

## 2013-08-23 NOTE — Telephone Encounter (Signed)
[facility] is requesting re-fill on glucose blood (ONE TOUCH ULTRA TEST) test strip, strips blue

## 2013-08-23 NOTE — Telephone Encounter (Signed)
I sent script e-scribe. 

## 2013-09-11 ENCOUNTER — Telehealth: Payer: Self-pay | Admitting: Family Medicine

## 2013-09-11 NOTE — Telephone Encounter (Signed)
Member services called for pt's insurance called to ask if we could change pt's rx for testing strips to reflect pt testing 3 x /day. Pt is paying $75 /for 90 day regardless of the number of times he test. Pt can get 300 for same copay of $75.00 Can we change pt's rx to state testing 3 x day? [facility]

## 2013-09-12 MED ORDER — GLUCOSE BLOOD VI STRP: 1.0000 | ORAL_STRIP | Freq: Three times a day (TID) | Status: DC

## 2013-09-12 NOTE — Telephone Encounter (Signed)
Ok per Dr Sarajane Jews, rx sent in electronically to [facility]

## 2013-09-14 ENCOUNTER — Telehealth: Payer: Self-pay | Admitting: Family Medicine

## 2013-09-14 MED ORDER — GLUCOSE BLOOD VI STRP: 1.0000 | ORAL_STRIP | Freq: Three times a day (TID) | Status: DC

## 2013-09-14 NOTE — Telephone Encounter (Signed)
I had to resend script for One Touch Ultra, okay per Dr. Sarajane Jews to test 3 times per day.

## 2013-09-19 ENCOUNTER — Telehealth: Payer: Self-pay | Admitting: Family Medicine

## 2013-09-19 NOTE — Telephone Encounter (Signed)
[facility] is requesting 90 day re-fill on [place] 102'S 30 G

## 2013-09-20 MED ORDER — ACCU-CHEK MULTICLIX LANCETS MISC: Status: DC

## 2013-09-20 NOTE — Telephone Encounter (Signed)
I sent script e-scribe. 

## 2013-10-03 ENCOUNTER — Encounter: Payer: Self-pay | Admitting: Family Medicine

## 2013-10-03 ENCOUNTER — Ambulatory Visit (INDEPENDENT_AMBULATORY_CARE_PROVIDER_SITE_OTHER): Payer: BC Managed Care – PPO | Admitting: Family Medicine

## 2013-10-03 VITALS — BP 132/76 | HR 91 | Temp 98.6°F | Ht 72.0 in | Wt 274.0 lb

## 2013-10-03 DIAGNOSIS — W57XXXA Bitten or stung by nonvenomous insect and other nonvenomous arthropods, initial encounter: Secondary | ICD-10-CM

## 2013-10-03 DIAGNOSIS — T148 Other injury of unspecified body region: Secondary | ICD-10-CM

## 2013-10-03 DIAGNOSIS — R0602 Shortness of breath: Secondary | ICD-10-CM

## 2013-10-03 NOTE — Progress Notes (Signed)
   Subjective:    Patient ID: Edward Olsen, male    DOB: [birth year], 58 y.o.   MRN: [id number]  HPI Here for 2 things. First he pulled a tick out of his skin on the left inner thigh about 5 days ago. The area around this remains red though not tender. He also has a few muscle aches that he is not sure if they are related or not. No fevers. Also he has frequent SOB. This is most evident during exertion but may occur at rest also. No chest pain and no coughing.  He knows he needs to lose weight. He had a normal cardiac stress test in January. He asks me to refer her to a lung specialist.    Review of Systems  Constitutional: Negative.   Respiratory: Positive for shortness of breath. Negative for cough, choking, chest tightness and wheezing.   Cardiovascular: Negative.   Skin: Positive for rash.       Objective:   Physical Exam  Constitutional: He appears well-developed and well-nourished.  Cardiovascular: Normal rate, regular rhythm, normal heart sounds and intact distal pulses.   Pulmonary/Chest: Effort normal and breath sounds normal. No respiratory distress. He has no wheezes. He has no rales. He exhibits no tenderness.  Skin:  4 cm area of macular erythema on the left thigh           Assessment & Plan:  He is already on Doxycycline 100 mg once a day, so I advised him to take this BID for 7 days. Refer to Pulmonary

## 2013-10-03 NOTE — Progress Notes (Signed)
Pre visit review using our clinic review tool, if applicable. No additional management support is needed unless otherwise documented below in the visit note. 

## 2013-10-09 ENCOUNTER — Ambulatory Visit (INDEPENDENT_AMBULATORY_CARE_PROVIDER_SITE_OTHER): Payer: BC Managed Care – PPO | Admitting: Physician Assistant

## 2013-10-09 ENCOUNTER — Encounter: Payer: Self-pay | Admitting: Physician Assistant

## 2013-10-09 VITALS — BP 110/70 | HR 94 | Temp 98.3°F | Resp 18 | Wt 270.0 lb

## 2013-10-09 DIAGNOSIS — M545 Low back pain, unspecified: Secondary | ICD-10-CM

## 2013-10-09 DIAGNOSIS — M109 Gout, unspecified: Secondary | ICD-10-CM

## 2013-10-09 LAB — URIC ACID: Uric Acid, Serum: 4.3 mg/dL (ref 4.0–7.8)

## 2013-10-09 MED ORDER — METHOCARBAMOL 500 MG PO TABS: 500.0000 mg | ORAL_TABLET | Freq: Four times a day (QID) | ORAL | Status: DC

## 2013-10-09 MED ORDER — PREDNISONE 20 MG PO TABS: 20.0000 mg | ORAL_TABLET | Freq: Two times a day (BID) | ORAL | Status: DC

## 2013-10-09 NOTE — Progress Notes (Signed)
Subjective:    Patient ID: Edward Olsen, male    DOB: [birth year], 58 y.o.   MRN: [id number]  Arm Pain  The incident occurred 3 to 5 days ago. Incident location: no injury. There was no injury mechanism. The pain is present in the right wrist and right hand. The quality of the pain is described as shooting, stabbing and aching. The pain does not radiate. The pain is at a severity of 7/10. The pain is moderate. The pain has been constant (kept awake all last night) since the incident. Pertinent negatives include no chest pain, muscle weakness, numbness or tingling. The symptoms are aggravated by movement and palpation. He has tried nothing for the symptoms.  Back Pain This is a new (happened when lifting a heavy concrete bench over the weekend,) problem. The current episode started in the past 7 days (3 days ago). The problem occurs constantly. The problem has been gradually worsening since onset. The pain is present in the lumbar spine. The quality of the pain is described as shooting and stabbing. The pain does not radiate. The pain is at a severity of 8/10. The pain is severe. The pain is worse during the day. The symptoms are aggravated by standing, bending and position. Pertinent negatives include no abdominal pain, bladder incontinence, bowel incontinence, chest pain, dysuria, fever, headaches, leg pain, numbness, paresis, paresthesias, pelvic pain, perianal numbness, tingling, weakness or weight loss. Risk factors include obesity. He has tried nothing for the symptoms.      Review of Systems  Constitutional: Negative for fever, chills and weight loss.  Respiratory: Negative for shortness of breath.   Cardiovascular: Negative for chest pain.  Gastrointestinal: Negative for nausea, vomiting, abdominal pain, diarrhea and bowel incontinence.  Genitourinary: Negative for bladder incontinence, dysuria, urgency, frequency and pelvic pain.  Musculoskeletal: Positive for arthralgias, back pain  and joint swelling. Negative for gait problem, myalgias, neck pain and neck stiffness.  Neurological: Negative for tingling, weakness, numbness, headaches and paresthesias.  All other systems reviewed and are negative.      Past Medical History  Diagnosis Date  . Diabetes mellitus     sees Dr. Chalmers Cater   . Thyroid disease   . Hyperlipidemia   . Hypertension   . Rosacea   . Hemorrhoids   . Restless leg syndrome   . Thrombocytopenia     sees Dr. Concha Norway   . Shingles Feb. 2015  . Sleep apnea, obstructive     wears CPAP   . Normal cardiac stress test Jan. 2015     per Dr. Lou Miner    Past Surgical History  Procedure Laterality Date  . Hemorrhoid surgery  2012  . Colonoscopy  10-04-06    per Dr. Ardis Hughs, polyps, repeat in 5 yrs   . Flexible sigmoidoscopy  07-08-10    per Dr. Ardis Hughs, internal hemorrhoids   . Septoplasty  2013    reports that he quit smoking about 28 years ago. His smoking use included Cigars. He has never used smokeless tobacco. He reports that he drinks about 7 ounces of alcohol per week. He reports that he does not use illicit drugs. family history includes Cancer in his mother; Diabetes in his mother; Heart disease in his father. There is no history of Colon cancer. No Known Allergies     Objective:   Physical Exam  Nursing note and vitals reviewed. Constitutional: He is oriented to person, place, and time. He appears well-developed and well-nourished. No distress.  HENT:  Head: Normocephalic and atraumatic.  Eyes: Conjunctivae and EOM are normal. Pupils are equal, round, and reactive to light.  Neck: Normal range of motion. Neck supple.  Cardiovascular: Normal rate, regular rhythm, normal heart sounds and intact distal pulses.  Exam reveals no gallop and no friction rub.   No murmur heard. Pulmonary/Chest: Effort normal and breath sounds normal. No respiratory distress. He has no wheezes. He has no rales. He exhibits no tenderness.  Musculoskeletal: Normal  range of motion. He exhibits tenderness. He exhibits no edema.  Pain to palpation of the left paraspinal lumbar musculature, no bony tenderness. Painful lumbar ROM.  SLR normal bilaterally.  Pain to palpation of the right wrist. Painful passive and active ROM of the right wrist against resistance. No pain to palpation of the MCP, PIP, DIP.   Neurological: He is alert and oriented to person, place, and time. He exhibits abnormal muscle tone (Right wrsit/hand strength slightly decreased compared with the left.).  Skin: Skin is warm and dry. No rash noted. He is not diaphoretic. There is erythema. No pallor.  Limited erythema of the right wrist.  Psychiatric: He has a normal mood and affect. His behavior is normal. Judgment and thought content normal.    Filed Vitals:   10/09/13 1112  BP: 110/70  Pulse: 94  Temp: 98.3 F (36.8 C)  Resp: 18   Lab Results  Component Value Date   WBC 5.8 04/20/2013   HGB 13.6 04/20/2013   HCT 39.6 04/20/2013   PLT 159 04/20/2013   GLUCOSE 101* 06/27/2013   CHOL 156 06/27/2013   TRIG 270.0* 06/27/2013   HDL 36.30* 06/27/2013   LDLDIRECT 86.0 06/27/2013   LDLCALC 61 12/22/2011   ALT 92* 06/27/2013   AST 60* 06/27/2013   NA 136 06/27/2013   K 3.9 06/27/2013   CL 99 06/27/2013   CREATININE 1.3 06/27/2013   BUN 21 06/27/2013   CO2 28 06/27/2013   TSH 0.54 09/05/2012   PSA 0.36 12/22/2011   HGBA1C 5.9 12/22/2011   MICROALBUR 0.7 12/22/2011         Assessment & Plan:  Visente was seen today for right arm and wrist pain and redness and back pain.  Diagnoses and associated orders for this visit:  Gout flare Comments: History of Gout, probably flare in wrist. Currently on BID Naproxen for knees, will treat with Prednisone for 7 days. - predniSONE (DELTASONE) 20 MG tablet; Take 1 tablet (20 mg total) by mouth 2 (two) times daily with a meal. - Uric Acid - Pt on NSAIDs without relief of pain symptoms, will treat with prednisone despite DM II. Made pt aware that  his blood sugars may spike a little while he is taking the prednisone, and that this should resolve after finishing the course, pt voiced understanding and agreed to take the medication.   Acute low back pain Comments: Muscle pain. Will treat with muscle relaxant and also course of prednisone. - methocarbamol (ROBAXIN) 500 MG tablet; Take 1 tablet (500 mg total) by mouth 4 (four) times daily. - predniSONE (DELTASONE) 20 MG tablet; Take 1 tablet (20 mg total) by mouth 2 (two) times daily with a meal. - The prednisone should also relieve his back pain symptoms.   Plan to follow up as needed for worsening or persistent symptoms despite treatment.  Patient Instructions  You'll have a uric acid level drawn today, we will call you with the results of this when it's available.  Prednisone twice a day for one  week for gout flare and back pain.  Be aware that due to your diabetes, prednisone may cause a spike in your blood sugar while you're taking it, and this should resolve after completing the course.  Robaxin up to 4 times daily as needed for muscle spasms.  You can wrap your wrist and an Ace bandage to try to give your wrist some extra support.  Followup as needed, or for persistent or worsening symptoms despite treatment.

## 2013-10-09 NOTE — Progress Notes (Signed)
Pre visit review using our clinic review tool, if applicable. No additional management support is needed unless otherwise documented below in the visit note. 

## 2013-10-09 NOTE — Patient Instructions (Signed)
You'll have a uric acid level drawn today, we will call you with the results of this when it's available.  Prednisone twice a day for one week for gout flare and back pain.  Be aware that due to your diabetes, prednisone may cause a spike in your blood sugar while you're taking it, and this should resolve after completing the course.  Robaxin up to 4 times daily as needed for muscle spasms.  You can wrap your wrist and an Ace bandage to try to give your wrist some extra support.  Followup as needed, or for persistent or worsening symptoms despite treatment.    Gout Gout is when your joints become red, sore, and swell (inflammed). This is caused by the buildup of uric acid crystals in the joints. Uric acid is a chemical that is normally in the blood. If the level of uric acid gets too high in the blood, these crystals form in your joints and tissues. Over time, these crystals can form into masses near the joints and tissues. These masses can destroy bone and cause the bone to look misshapen (deformed). HOME CARE   Do not take aspirin for pain.  Only take medicine as told by your doctor.  Rest the joint as much as you can. When in bed, keep sheets and blankets off painful areas.  Keep the sore joints raised (elevated).  Put warm or cold packs on painful joints. Use of warm or cold packs depends on which works best for you.  Use crutches if the painful joint is in your leg.  Drink enough fluids to keep your pee (urine) clear or pale yellow. Limit alcohol, sugary drinks, and drinks with fructose in them.  Follow your diet instructions. Pay careful attention to how much protein you eat. Include fruits, vegetables, whole grains, and fat-free or low-fat milk products in your daily diet. Talk to your doctor or dietician about the use of coffee, vitamin C, and cherries. These may help lower uric acid levels.  Keep a healthy body weight. GET HELP RIGHT AWAY IF:   You have watery poop  (diarrhea), throw up (vomit), or have any side effects from medicines.  You do not feel better in 24 hours, or you are getting worse.  Your joint becomes suddenly more tender, and you have chills or a fever. MAKE SURE YOU:   Understand these instructions.  Will watch your condition.  Will get help right away if you are not doing well or get worse. Document Released: 01/28/2008 Document Revised: 08/15/2012 Document Reviewed: 07/29/2009 [facility] Patient Information 2014 [place].

## 2013-10-11 ENCOUNTER — Ambulatory Visit (INDEPENDENT_AMBULATORY_CARE_PROVIDER_SITE_OTHER): Payer: BC Managed Care – PPO | Admitting: Internal Medicine

## 2013-10-11 ENCOUNTER — Ambulatory Visit (INDEPENDENT_AMBULATORY_CARE_PROVIDER_SITE_OTHER)
Admission: RE | Admit: 2013-10-11 | Discharge: 2013-10-11 | Disposition: A | Discharge status: Home / Self Care | Payer: BC Managed Care – PPO | Source: Ambulatory Visit | Attending: Internal Medicine | Admitting: Internal Medicine

## 2013-10-11 ENCOUNTER — Encounter: Payer: Self-pay | Admitting: Internal Medicine

## 2013-10-11 VITALS — BP 128/74 | HR 74 | Temp 98.5°F | Ht 71.0 in | Wt 276.0 lb

## 2013-10-11 DIAGNOSIS — R0609 Other forms of dyspnea: Secondary | ICD-10-CM

## 2013-10-11 DIAGNOSIS — R0600 Dyspnea, unspecified: Secondary | ICD-10-CM

## 2013-10-11 DIAGNOSIS — R0989 Other specified symptoms and signs involving the circulatory and respiratory systems: Secondary | ICD-10-CM

## 2013-10-11 DIAGNOSIS — I10 Essential (primary) hypertension: Secondary | ICD-10-CM

## 2013-10-11 MED ORDER — FAMOTIDINE 20 MG PO TABS: ORAL_TABLET | ORAL | Status: DC

## 2013-10-11 MED ORDER — PANTOPRAZOLE SODIUM 40 MG PO TBEC: 40.0000 mg | DELAYED_RELEASE_TABLET | Freq: Every day | ORAL | Status: DC

## 2013-10-11 MED ORDER — IRBESARTAN 75 MG PO TABS: 75.0000 mg | ORAL_TABLET | Freq: Every day | ORAL | Status: DC

## 2013-10-11 NOTE — Progress Notes (Signed)
Subjective:    Patient ID: Edward Olsen, male    DOB: [birth year],  MRN: [id number]  HPI  76 yowm quit smoking in 1987 with no sequelae at wt around 210 and first started noting doe around xmas of 2014 gradually worse and referred to pulmonary 10/11/2013 by Dr Sarajane Jews   10/11/2013 1st [facility] office visit/ [name] / on ACEi Chief Complaint  Patient presents with  . Pulmonary Consult    Referred per Dr. Delma Freeze. Pt c/o DOE x 4 months. Gets SOB walking up hills and walking long distances on a flat surface. He also c/o occ non prod cough.   about same time began dry cough to point where thought cracked a rib rx with pain med, dx as bronchitis some better with cough med but coughs to point of dry heaving and gagging and assoc with bad heart burn.  Started cpap x 4 months restless no sob at hs but still can't lie flat s smothering sensation  Gained most of the wt x last 6 months  No obvious other patterns in day to day or daytime variabilty or assoc  cp or chest tightness, subjective wheeze overt sinus     symptoms. No unusual exp hx or h/o childhood pna/ asthma or knowledge of premature birth.  Sleeping ok without nocturnal  or early am exacerbation  of respiratory  c/o's or need for noct saba. Also denies any obvious fluctuation of symptoms with weather or environmental changes or other aggravating or alleviating factors except as outlined above   Current Medications, Allergies, Complete Past Medical History, Past Surgical History, Family History, and Social History were reviewed in [employer] record.              Review of Systems  Constitutional: Negative for fever, chills, activity change, appetite change and unexpected weight change.  HENT: Negative for congestion, dental problem, postnasal drip, rhinorrhea, sneezing, sore throat, trouble swallowing and voice change.   Eyes: Negative for visual disturbance.  Respiratory: Positive for cough and  shortness of breath. Negative for choking.   Cardiovascular: Negative for chest pain and leg swelling.  Gastrointestinal: Negative for nausea, vomiting and abdominal pain.  Genitourinary: Negative for difficulty urinating.       Acid Heartburn  Musculoskeletal: Positive for arthralgias.  Skin: Negative for rash.  Psychiatric/Behavioral: Negative for behavioral problems and confusion.       Objective:   Physical Exam   Wt Readings from Last 3 Encounters:  10/11/13 276 lb (125.193 kg)  10/09/13 270 lb (122.471 kg)  10/03/13 274 lb (124.286 kg)     HEENT: nl dentition, turbinates, and orophanx. Nl external ear canals without cough reflex   NECK :  without JVD/Nodes/TM/ nl carotid upstrokes bilaterally   LUNGS: no acc muscle use, clear to A and P bilaterally without cough on insp or exp maneuvers   CV:  RRR  no s3 or murmur or increase in P2, no edema   ABD:  soft and nontender with nl excursion in the supine position. No bruits or organomegaly, bowel sounds nl  MS:  warm without deformities, calf tenderness, cyanosis or clubbing  SKIN: warm and dry without lesions    NEURO:  alert, approp, no deficits   CXR  10/11/2013 :  Low lung volumes. The heart size and mediastinal contours are within  normal limits. Linear density within the left lung base. Both lungs  are otherwise clear. The visualized skeletal structures are  unremarkable.  Assessment & Plan:

## 2013-10-11 NOTE — Patient Instructions (Addendum)
Stop lisinopril and your breathing   should gradually improve  Start avapro 75 mg one daily   Pantoprazole (protonix) 40 mg   Take 30-60 min before first meal of the day and Pepcid 20 mg one bedtime until return to office - this is the best way to tell whether stomach acid is contributing to your problem.    GERD (REFLUX)  is an extremely common cause of respiratory symptoms, many times with no significant heartburn at all.    It can be treated with medication, but also with lifestyle changes including avoidance of late meals, excessive alcohol, smoking cessation, and avoid fatty foods, chocolate, peppermint, colas, red wine, and acidic juices such as orange juice.  NO MINT OR MENTHOL PRODUCTS SO NO COUGH DROPS  USE SUGARLESS CANDY INSTEAD (jolley ranchers or Stover's)  NO OIL BASED VITAMINS - use powdered substitutes.  Please remember to go to the x-ray department downstairs for your tests - we will call you with the results when they are available.    Please schedule a follow up office visit in 4 weeks, sooner if needed with pfts on return

## 2013-10-11 NOTE — Progress Notes (Signed)
Quick Note:  Spoke with pt and notified of results per Dr. Wert. Pt verbalized understanding and denied any questions.  ______ 

## 2013-10-13 NOTE — Assessment & Plan Note (Signed)
Symptoms are markedly disproportionate to objective findings and not clear this is a lung problem but pt does appear to have difficult airway management issues. DDX of  difficult airways managment all start with A and  include Adherence, Ace Inhibitors, Acid Reflux, Active Sinus Disease, Alpha 1 Antitripsin deficiency, Anxiety masquerading as Airways dz,  ABPA,  allergy(esp in young), Aspiration (esp in elderly), Adverse effects of DPI,  Active smokers, plus two Bs  = Bronchiectasis and Beta blocker use..and one C= CHF  ACEi at top of the usual list of suspects since in addition to the sob he coughs so hard he thinks he broke a rib and coughs to point of gagging See hbp  ? Acid (or non-acid) GERD > always difficult to exclude as up to 75% of pts in some series report no assoc GI/ Heartburn symptoms> rec max (24h)  acid suppression and diet restrictions/ reviewed and instructions given in writing.

## 2013-10-13 NOTE — Assessment & Plan Note (Signed)
ACE inhibitors are problematic in  pts with airway complaints because  even experienced pulmonologists can't always distinguish ace effects from copd/asthma.  By themselves they don't actually cause a problem, much like oxygen can't by itself start a fire, but they certainly serve as a powerful catalyst or enhancer for any "fire"  or inflammatory process in the upper airway, be it caused by an ET  tube or more commonly reflux (especially in the obese or pts with known GERD or who are on biphoshonates).    In the era of ARB near equivalency until we have a better handle on the reversibility of the airway problem, it just makes sense to avoid ACEI  entirely in the short run  - this is the only way I know to prove the cause and effect.  Try avapro 75 mg replacement then regroup in 4 weeks

## 2013-11-16 ENCOUNTER — Ambulatory Visit (INDEPENDENT_AMBULATORY_CARE_PROVIDER_SITE_OTHER): Payer: BC Managed Care – PPO | Admitting: Internal Medicine

## 2013-11-16 ENCOUNTER — Encounter: Payer: Self-pay | Admitting: Internal Medicine

## 2013-11-16 VITALS — BP 138/60 | HR 77 | Temp 98.1°F | Ht 69.5 in | Wt 279.0 lb

## 2013-11-16 DIAGNOSIS — R0609 Other forms of dyspnea: Secondary | ICD-10-CM

## 2013-11-16 DIAGNOSIS — R0600 Dyspnea, unspecified: Secondary | ICD-10-CM

## 2013-11-16 DIAGNOSIS — R0989 Other specified symptoms and signs involving the circulatory and respiratory systems: Secondary | ICD-10-CM

## 2013-11-16 DIAGNOSIS — I10 Essential (primary) hypertension: Secondary | ICD-10-CM

## 2013-11-16 NOTE — Progress Notes (Signed)
Subjective:    Patient ID: Edward Olsen, male    DOB: 04-19-1956,  MRN: [id number]  HPI  41 yowm quit smoking in 1987 with no sequelae at wt around 210 and first started noting doe around xmas of 2014 gradually worse and referred to pulmonary 10/11/2013 by Dr Edward Olsen with PFT's showing only low ERV 11/16/13 c/w obesity    10/11/2013 1st  Pulmonary office visit/ Edward Olsen / on ACEi Chief Complaint  Patient presents with  . Pulmonary Consult    Referred per Dr. [name]. Pt c/o DOE x 4 months. Gets SOB walking up hills and walking long distances on a flat surface. He also c/o occ non prod cough.   about same time began dry cough to point where thought cracked a rib rx with pain med, dx as bronchitis some better with cough med but coughs to point of dry heaving and gagging and assoc with bad heart burn. Started cpap x 4 months restless no sob at hs but still can't lie flat s smothering sensation  Gained most of the wt x last 6 months Rec Stop lisinopril and your breathing   should gradually improve Start avapro 75 mg one daily  Pantoprazole (protonix) 40 mg   Take 30-60 min before first meal of the day and Pepcid 20 mg one bedtime until return to office - this is the best way to tell whether stomach acid is contributing to your problem.   GERD diet   11/16/2013 f/u ov/Edward Olsen re: ? acei cough/ unexplained sob  Chief Complaint  Patient presents with  . Follow-up    Pt states his cough has resolved. Pt states he is stlill having DOE (when walking up hills or stairs). Denies CP/tightness.        No obvious day to day or daytime variabilty or assoc  cp or chest tightness, subjective wheeze overt sinus or hb symptoms. No unusual exp hx or h/o childhood pna/ asthma or knowledge of premature birth.  Sleeping ok without nocturnal  or early am exacerbation  of respiratory  c/o's or need for noct saba. Also denies any obvious fluctuation of symptoms with weather or environmental changes or other  aggravating or alleviating factors except as outlined above   Current Medications, Allergies, Complete Past Medical History, Past Surgical History, Family History, and Social History were reviewed in [employer] record.  ROS  The following are not active complaints unless bolded sore throat, dysphagia, dental problems, itching, sneezing,  nasal congestion or excess/ purulent secretions, ear ache,   fever, chills, sweats, unintended wt loss, pleuritic or exertional cp, hemoptysis,  orthopnea pnd or leg swelling, presyncope, palpitations, heartburn, abdominal pain, anorexia, nausea, vomiting, diarrhea  or change in bowel or urinary habits, change in stools or urine, dysuria,hematuria,  rash, arthralgias, visual complaints, headache, numbness weakness or ataxia or problems with walking or coordination,  change in mood/affect or memory.                          Objective:   Physical Exam  11/16/2013        279  Wt Readings from Last 3 Encounters:  10/11/13 276 lb (125.193 kg)  10/09/13 270 lb (122.471 kg)  10/03/13 274 lb (124.286 kg)     HEENT: nl dentition, turbinates, and orophanx. Nl external ear canals without cough reflex   NECK :  without JVD/Nodes/TM/ nl carotid upstrokes bilaterally   LUNGS: no acc muscle use,  clear to A and P bilaterally without cough on insp or exp maneuvers   CV:  RRR  no s3 or murmur or increase in P2, no edema   ABD:  soft and nontender with nl excursion in the supine position. No bruits or organomegaly, bowel sounds nl  MS:  warm without deformities, calf tenderness, cyanosis or clubbing  SKIN: warm and dry without lesions    NEURO:  alert, approp, no deficits   CXR  10/11/2013 :  Low lung volumes. The heart size and mediastinal contours are within  normal limits. Linear density within the left lung base. Both lungs  are otherwise clear. The visualized skeletal structures are  unremarkable.        Assessment &  Plan:

## 2013-11-16 NOTE — Progress Notes (Signed)
PFT done today. 

## 2013-11-16 NOTE — Patient Instructions (Signed)
Stop the protonix and increase the pepcid to twice daily after bfast and bedtime  x one week then as needed thereafter  The only abnormality on your lung function is low ERV = effect the belly on your breathing  Follow up is as needed.

## 2013-11-18 NOTE — Assessment & Plan Note (Addendum)
Trial off ACEi 10/11/13 due to cough/sob> cough 100% resolved   Adequate control on present rx, reviewed > no change in rx needed  (avapro 75 mg daily ok pending f/u with primary care and defer longterm rx to him)  rec maintain off acei indefinitely

## 2013-11-18 NOTE — Assessment & Plan Note (Addendum)
-   trial off acei 10/11/2013 > cough resolved but no change doe - PFTs 11/16/13  wnl except for ERV 41% c/w effects of obesity  I had an extended summary final discussion with the patient today lasting 15 to 20 minutes of a 25 minute visit on the following issues:   Reviewed pfts in detail showing no airflow obst at all but significant reduction in ERV c/w body habitus  Ok to wean off gerd rx unless overt hb or cough recur/ work hard on diet/ ex (cal balance reviewed)  No pulmonary f/u needed

## 2013-11-19 ENCOUNTER — Other Ambulatory Visit: Payer: Self-pay | Admitting: Family Medicine

## 2013-11-19 LAB — PULMONARY FUNCTION TEST
DL/VA % PRED: 85 %
DL/VA: 3.95 ml/min/mmHg/L
DLCO unc % pred: 68 %
DLCO unc: 21.56 ml/min/mmHg
FEF 25-75 Post: 2.65 L/sec
FEF 25-75 Pre: 2.65 L/sec
FEF2575-%Change-Post: 0 %
FEF2575-%PRED-PRE: 87 %
FEF2575-%Pred-Post: 87 %
FEV1-%CHANGE-POST: 3 %
FEV1-%PRED-PRE: 79 %
FEV1-%Pred-Post: 82 %
FEV1-Post: 2.97 L
FEV1-Pre: 2.86 L
FEV1FVC-%Change-Post: 0 %
FEV1FVC-%PRED-PRE: 104 %
FEV6-%Change-Post: 3 %
FEV6-%Pred-Post: 81 %
FEV6-%Pred-Pre: 79 %
FEV6-Post: 3.72 L
FEV6-Pre: 3.59 L
FEV6FVC-%PRED-PRE: 104 %
FEV6FVC-%Pred-Post: 104 %
FVC-%Change-Post: 3 %
FVC-%Pred-Post: 78 %
FVC-%Pred-Pre: 75 %
FVC-Post: 3.72 L
FVC-Pre: 3.59 L
POST FEV1/FVC RATIO: 80 %
PRE FEV6/FVC RATIO: 100 %
Post FEV6/FVC ratio: 100 %
Pre FEV1/FVC ratio: 80 %
RV % pred: 87 %
RV: 1.91 L
TLC % pred: 86 %
TLC: 5.94 L

## 2013-11-26 ENCOUNTER — Other Ambulatory Visit: Payer: Self-pay | Admitting: Family Medicine

## 2013-11-28 ENCOUNTER — Other Ambulatory Visit: Payer: Self-pay | Admitting: Internal Medicine

## 2013-11-28 MED ORDER — IRBESARTAN 75 MG PO TABS: 75.0000 mg | ORAL_TABLET | Freq: Every day | ORAL | Status: DC

## 2013-12-14 ENCOUNTER — Telehealth: Payer: Self-pay | Admitting: *Deleted

## 2013-12-14 NOTE — Telephone Encounter (Signed)
Pt needs hgba1c, lipid, bmet, micro albumin for diabetic bundle.  Left message for pt to call back

## 2013-12-14 NOTE — Telephone Encounter (Signed)
Pt states he is going regularly to dr balan/ [facility] and they are doing all the labs except the liver panel and lipid. Pt would like to get these done there if he can get an order insteas of having extra labs done. Pt wold like a cb w/ instructions on how he can get these labs done there.

## 2013-12-15 NOTE — Telephone Encounter (Signed)
Please advise if this is okay?

## 2013-12-18 NOTE — Telephone Encounter (Signed)
Yes just orders for lipids and liver is okay

## 2013-12-19 NOTE — Telephone Encounter (Signed)
That's fine with me. Just ask Dr. Chalmers Cater to include these with the next labs that she orders

## 2013-12-19 NOTE — Telephone Encounter (Signed)
Pt would like to get these labs at dr balan's office if he can get an order. Pt is already getting labs there and then he get get stuck only once.

## 2013-12-19 NOTE — Telephone Encounter (Signed)
I left a voice message with the below information.

## 2013-12-19 NOTE — Telephone Encounter (Signed)
Please schedule lab appt

## 2014-01-03 ENCOUNTER — Ambulatory Visit (INDEPENDENT_AMBULATORY_CARE_PROVIDER_SITE_OTHER): Payer: BC Managed Care – PPO | Admitting: Family Medicine

## 2014-01-03 ENCOUNTER — Telehealth: Payer: Self-pay | Admitting: Family Medicine

## 2014-01-03 ENCOUNTER — Encounter: Payer: Self-pay | Admitting: Family Medicine

## 2014-01-03 VITALS — BP 129/74 | HR 97 | Temp 98.4°F | Ht 69.5 in | Wt 279.0 lb

## 2014-01-03 DIAGNOSIS — R1011 Right upper quadrant pain: Secondary | ICD-10-CM

## 2014-01-03 NOTE — Telephone Encounter (Signed)
Pt states the triage nurse had told her dr fry's last appt is 4:30. and pt is not leaving work until 4:15. Advised pt his last appt was 4:15. Pt insist dr fry wants to see him today for hernia, but he will be late. I told him I would send a message.

## 2014-01-03 NOTE — Progress Notes (Signed)
Pre visit review using our clinic review tool, if applicable. No additional management support is needed unless otherwise documented below in the visit note. 

## 2014-01-03 NOTE — Progress Notes (Signed)
   Subjective:    Patient ID: Edward Olsen, male    DOB: Oct 03, 1955, 58 y.o.   MRN: [id number]  HPI Here for several weeks of intermittent upper abdominal pains. These had been mild and only lasted an hour or so, but for the past few days they have been more intense and lasting longer. He had made no association with food until today. He felt fine when he woke up this morning and he had a bagel for breakfast. About an hour later he started to have moderate pains across the upper abdomen which lasted several hours and then went away. He had mild nausea with this but did not vomit.. No fever. Then he had a [place] steak and cheese sandwich with chips for lunch. About one hour after that the pains returned and lasted several hours. His BMs are regular but he thinks the color of his stools has gotten lighter. No urninary sx. No SOB or chest pains. He has a known umbilical hernia but it usually does not bother him.    Review of Systems  Constitutional: Negative.   Respiratory: Negative.   Cardiovascular: Negative.   Gastrointestinal: Positive for nausea and abdominal pain. Negative for vomiting, diarrhea, constipation, blood in stool, abdominal distention, anal bleeding and rectal pain.  Genitourinary: Negative.        Objective:   Physical Exam  Constitutional: He is oriented to person, place, and time. He appears well-developed and well-nourished. No distress.  Cardiovascular: Normal rate, regular rhythm, normal heart sounds and intact distal pulses.   Pulmonary/Chest: Effort normal and breath sounds normal.  Abdominal: Soft. Bowel sounds are normal. He exhibits no distension. There is no rebound and no guarding.  Mildly tender in the RUQ. He has a small slightly tender reducible umbilical hernia   Neurological: He is alert and oriented to person, place, and time.          Assessment & Plan:  He likely has gall bladder disease. Get labs today and set up an abdominal US soon. He knows  to avoid greasy or fatty foods.

## 2014-01-03 NOTE — Telephone Encounter (Signed)
LM on vm appt is ok.

## 2014-01-03 NOTE — Telephone Encounter (Signed)
Okay per Dr. Sarajane Jews

## 2014-01-04 ENCOUNTER — Telehealth: Payer: Self-pay | Admitting: Family Medicine

## 2014-01-04 LAB — CBC WITH DIFFERENTIAL/PLATELET
Basophils Absolute: 0.1 10*3/uL (ref 0.0–0.1)
Basophils Relative: 1 % (ref 0.0–3.0)
EOS ABS: 0.1 10*3/uL (ref 0.0–0.7)
EOS PCT: 2.1 % (ref 0.0–5.0)
HEMATOCRIT: 40.9 % (ref 39.0–52.0)
Hemoglobin: 13.8 g/dL (ref 13.0–17.0)
LYMPHS ABS: 1.2 10*3/uL (ref 0.7–4.0)
Lymphocytes Relative: 21.8 % (ref 12.0–46.0)
MCHC: 33.6 g/dL (ref 30.0–36.0)
MCV: 93 fl (ref 78.0–100.0)
MONO ABS: 0.4 10*3/uL (ref 0.1–1.0)
Monocytes Relative: 8.1 % (ref 3.0–12.0)
Neutro Abs: 3.7 10*3/uL (ref 1.4–7.7)
Neutrophils Relative %: 67 % (ref 43.0–77.0)
PLATELETS: 191 10*3/uL (ref 150.0–400.0)
RBC: 4.4 Mil/uL (ref 4.22–5.81)
RDW: 14.4 % (ref 11.5–15.5)
WBC: 5.5 10*3/uL (ref 4.0–10.5)

## 2014-01-04 LAB — HEPATIC FUNCTION PANEL
ALT: 52 U/L (ref 0–53)
AST: 40 U/L — ABNORMAL HIGH (ref 0–37)
Albumin: 4.4 g/dL (ref 3.5–5.2)
Alkaline Phosphatase: 36 U/L — ABNORMAL LOW (ref 39–117)
BILIRUBIN TOTAL: 0.7 mg/dL (ref 0.2–1.2)
Bilirubin, Direct: 0 mg/dL (ref 0.0–0.3)
Total Protein: 7 g/dL (ref 6.0–8.3)

## 2014-01-04 LAB — BASIC METABOLIC PANEL
BUN: 18 mg/dL (ref 6–23)
CALCIUM: 9.9 mg/dL (ref 8.4–10.5)
CO2: 28 mEq/L (ref 19–32)
Chloride: 103 mEq/L (ref 96–112)
Creatinine, Ser: 1.3 mg/dL (ref 0.4–1.5)
GFR: 61.31 mL/min (ref >60.00)
Glucose, Bld: 92 mg/dL (ref 70–99)
Potassium: 3.9 mEq/L (ref 3.5–5.1)
SODIUM: 138 meq/L (ref 135–145)

## 2014-01-04 LAB — AMYLASE: AMYLASE: 45 U/L (ref 27–131)

## 2014-01-04 LAB — LIPASE: LIPASE: 19 U/L (ref 11.0–59.0)

## 2014-01-04 NOTE — Telephone Encounter (Addendum)
Pt stated [facility] can not  see him until 01-12-14 and pt did not take that appt. They had appt avail for today at 1pm, pt decline due to being a diabetic. Pt would like to go somewhere else  Pt is aware md out of office today

## 2014-01-05 ENCOUNTER — Other Ambulatory Visit: Payer: Self-pay | Admitting: Family Medicine

## 2014-01-05 ENCOUNTER — Ambulatory Visit
Admission: RE | Admit: 2014-01-05 | Discharge: 2014-01-05 | Disposition: A | Discharge status: Home / Self Care | Payer: BC Managed Care – PPO | Source: Ambulatory Visit | Attending: Family Medicine | Admitting: Family Medicine

## 2014-01-05 ENCOUNTER — Ambulatory Visit: Payer: BC Managed Care – PPO | Admitting: Family Medicine

## 2014-01-05 ENCOUNTER — Telehealth: Payer: Self-pay | Admitting: Family Medicine

## 2014-01-05 DIAGNOSIS — R1011 Right upper quadrant pain: Secondary | ICD-10-CM

## 2014-01-05 NOTE — Telephone Encounter (Signed)
Pt is having the ultrasound done now, he is at that location.

## 2014-01-05 NOTE — Telephone Encounter (Signed)
Tell him his US showed a normal gall bladder

## 2014-01-05 NOTE — Telephone Encounter (Signed)
Please try to reschedule the [place]

## 2014-01-05 NOTE — Telephone Encounter (Signed)
I left a message at the pts cell and home number to return my call.

## 2014-01-05 NOTE — Telephone Encounter (Signed)
Pt called this am to fu on an earlier appt. Pt has been scheduled today at 2 pm.per [facility].

## 2014-01-09 ENCOUNTER — Telehealth: Payer: Self-pay | Admitting: Family Medicine

## 2014-01-09 DIAGNOSIS — K429 Umbilical hernia without obstruction or gangrene: Secondary | ICD-10-CM

## 2014-01-09 NOTE — Telephone Encounter (Signed)
Patient Information:  Caller Name: Jasiyah  Phone: [phone]  Patient: Edward Olsen, Edward Olsen  Gender: Male  DOB: [birth year]  Age: 58 Years  PCP: Alysia Penna [facility])  Office Follow Up:  Does the office need to follow up with this patient?: Yes  Instructions For The Office: Please call and advise   Symptoms  Reason For Call & Symptoms: Pt is calling to say he has ongoing abdominal pain around his umbilical area. No vomiting/no diarrhea/no constipation. Pt was seen at the office on 01/03/14 for RUQ pain. Pt was sent to gallbladder studies which were normal on Friday 01/05/14. Pt has had same pain on going . Today it is worse. It was an 8/10. The pain is intermittent. It has subsided for now since the pt sat down.  Pt has been avoiding fatty/greasy foods - he had cereal and fruit this am.  Reviewed Health History In EMR: Yes  Reviewed Medications In EMR: Yes  Reviewed Allergies In EMR: Yes  Reviewed Surgeries / Procedures: Yes  Date of Onset of Symptoms: 01/03/2014  Guideline(s) Used:  Abdominal Pain - Male  Disposition Per Guideline:   See Today in Office  Reason For Disposition Reached:   Mild pain that comes and goes (cramps) lasts > 24 hours  Advice Given:  N/A  Patient Refused Recommendation:  Patient Will Follow Up With Office Later  Pt seen for this. Gallbladder disease r/o. What is the next step?

## 2014-01-09 NOTE — Telephone Encounter (Signed)
I left a voice message for pt, advising that Dr. Sarajane Jews is out of the office today & will be returning on Wednesday 01/10/14. I will forward this note to the doctor. Pt wants to know what is the next step, since the ultrasound was normal?

## 2014-01-09 NOTE — Telephone Encounter (Signed)
I left a voice message with normal gallbladder results and released in my chart.

## 2014-01-10 NOTE — Telephone Encounter (Signed)
I will refer him to Surgery to evaluate both the pain and the hernia

## 2014-01-10 NOTE — Telephone Encounter (Signed)
I spoke with pt  

## 2014-01-12 ENCOUNTER — Other Ambulatory Visit: Payer: Self-pay | Admitting: Family Medicine

## 2014-01-30 ENCOUNTER — Ambulatory Visit (INDEPENDENT_AMBULATORY_CARE_PROVIDER_SITE_OTHER): Payer: BC Managed Care – PPO | Admitting: Family Medicine

## 2014-01-30 ENCOUNTER — Encounter: Payer: Self-pay | Admitting: Family Medicine

## 2014-01-30 VITALS — BP 137/76 | HR 86 | Temp 98.1°F | Ht 69.5 in | Wt 279.0 lb

## 2014-01-30 DIAGNOSIS — R1011 Right upper quadrant pain: Secondary | ICD-10-CM

## 2014-01-30 DIAGNOSIS — G5601 Carpal tunnel syndrome, right upper limb: Secondary | ICD-10-CM

## 2014-01-30 DIAGNOSIS — G5600 Carpal tunnel syndrome, unspecified upper limb: Secondary | ICD-10-CM

## 2014-01-30 NOTE — Progress Notes (Signed)
   Subjective:    Patient ID: Edward Olsen, male    DOB: 1955-09-01, 58 y.o.   MRN: [id number]  HPI Here to follow up on RUQ pain and to discuss a new problem of mild numbness and weakness in the right hand. We saw him for the RUQ pain recently and got labs which were normal, as well as an abdominal US which was normal. It was our understanding that he had been taking Protonix at that time but he now says he has not taken this for a long time. Now he has had the onset of weakness and numbness in the right hand. No pain or swelling. The other hand is fine and he denies any other neurologic deficits. 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 He also spends time at home either on a tablet or on his cell phone.    Review of Systems  Gastrointestinal: Positive for abdominal pain. Negative for nausea, vomiting, diarrhea, constipation, blood in stool, abdominal distention, anal bleeding and rectal pain.  Neurological: Positive for weakness and numbness. Negative for dizziness, tremors, seizures, syncope, facial asymmetry, speech difficulty and headaches.       Objective:   Physical Exam  Constitutional: He is oriented to person, place, and time. He appears well-developed and well-nourished.  Abdominal: Soft. Bowel sounds are normal. He exhibits no distension and no mass. There is no tenderness. There is no rebound and no guarding.  Musculoskeletal: Normal range of motion. He exhibits no edema and no tenderness.  Neurological: He is alert and oriented to person, place, and time. He has normal reflexes. No cranial nerve deficit. He exhibits normal muscle tone. Coordination normal.  Exam of his right hand is normal           Assessment & Plan:  He seems to have some carpal tunnel syndrome in the right wrist so I advised him to wear a wrist splint continuously for the next few weeks. His RUQ may well be duodenitis from his daily  NSAIDs, so he will try taking Prilosec OTC to take 2 tabs daily.

## 2014-01-30 NOTE — Progress Notes (Signed)
Pre visit review using our clinic review tool, if applicable. No additional management support is needed unless otherwise documented below in the visit note. 

## 2014-01-31 ENCOUNTER — Telehealth: Payer: Self-pay | Admitting: Family Medicine

## 2014-01-31 NOTE — Telephone Encounter (Signed)
emmi emailed °

## 2014-02-02 ENCOUNTER — Other Ambulatory Visit (INDEPENDENT_AMBULATORY_CARE_PROVIDER_SITE_OTHER): Payer: Self-pay | Admitting: Surgery

## 2014-02-02 DIAGNOSIS — R1084 Generalized abdominal pain: Secondary | ICD-10-CM

## 2014-02-14 ENCOUNTER — Telehealth: Payer: Self-pay | Admitting: Family Medicine

## 2014-02-14 DIAGNOSIS — G5600 Carpal tunnel syndrome, unspecified upper limb: Secondary | ICD-10-CM

## 2014-02-14 NOTE — Telephone Encounter (Signed)
Pt states the wrist band he was told to wear for carpal tunnel is not working.  He states his wrist hasn't gotten any better and his pinky finger is still curled under.  Pt would like a callback advising of the next plan of care.  He said you can leave a detailed message on his vm if needed.

## 2014-02-15 NOTE — Telephone Encounter (Signed)
I left a voice message for pt to return my call.  

## 2014-02-15 NOTE — Telephone Encounter (Signed)
Pt called back and he will use whoever we recommend . Would like a call back

## 2014-02-15 NOTE — Telephone Encounter (Signed)
I suggest we refer him to a Copy to evaluate. Ask him if there is one he would prefer to see

## 2014-02-16 ENCOUNTER — Other Ambulatory Visit: Payer: Self-pay

## 2014-02-16 NOTE — Telephone Encounter (Signed)
I left a voice message with below information. 

## 2014-02-16 NOTE — Telephone Encounter (Signed)
Referral was done  

## 2014-02-17 ENCOUNTER — Other Ambulatory Visit: Payer: Self-pay | Admitting: Internal Medicine

## 2014-02-23 ENCOUNTER — Ambulatory Visit (HOSPITAL_COMMUNITY)
Admission: RE | Admit: 2014-02-23 | Discharge: 2014-02-23 | Disposition: A | Discharge status: Home / Self Care | Payer: BC Managed Care – PPO | Source: Ambulatory Visit | Attending: Surgery | Admitting: Surgery

## 2014-02-23 DIAGNOSIS — R1084 Generalized abdominal pain: Secondary | ICD-10-CM | POA: Insufficient documentation

## 2014-02-23 MED ORDER — SINCALIDE 5 MCG IJ SOLR
INTRAMUSCULAR | Status: AC
  Administered 2014-02-23: 2.45 ug via INTRAVENOUS
  Filled 2014-02-23: qty 10

## 2014-02-23 MED ORDER — TECHNETIUM TC 99M MEBROFENIN IV KIT
5.0000 | PACK | Freq: Once | INTRAVENOUS | Status: AC | PRN
  Administered 2014-02-23: 5 via INTRAVENOUS

## 2014-02-23 MED ORDER — SINCALIDE 5 MCG IJ SOLR
0.0200 ug/kg | Freq: Once | INTRAMUSCULAR | Status: AC
  Administered 2014-02-23: 2.45 ug via INTRAVENOUS

## 2014-03-05 ENCOUNTER — Telehealth: Payer: Self-pay | Admitting: Internal Medicine

## 2014-03-05 MED ORDER — IRBESARTAN 75 MG PO TABS: 75.0000 mg | ORAL_TABLET | Freq: Every day | ORAL | Status: DC

## 2014-03-05 NOTE — Telephone Encounter (Signed)
lmtcb for pt.  

## 2014-03-05 NOTE — Telephone Encounter (Signed)
Spoke with pt - reports he is needing Irbesartan rx.  Only has 3-4 days left.  Rx was sent to [facility] on November 28, 2013 asking to defer future refills to PCP. Pt reports he was unaware of this request and would like 30 day rx sent to local pharm so there is no break in therapy. 30 Day Irbesartan rx sent to [facility] on [address].  Pt aware and will contact PCP during this time to requesting 90 day rx to [facility]. Pt is to call back if there are any problems with obtaining rx from PCP. He verbalized understanding of instructions, is in agreement with plan, and voiced no further questions or concerns at this time.

## 2014-03-06 ENCOUNTER — Ambulatory Visit (INDEPENDENT_AMBULATORY_CARE_PROVIDER_SITE_OTHER): Payer: Self-pay | Admitting: Surgery

## 2014-03-06 NOTE — H&P (Signed)
History of Present Illness Edward Olsen. Edward Dayrit MD; 03/06/2014 9:41 AM) Patient words: discuss GB.  The patient is a 58 year old male who presents with an umbilical hernia. History of Present Illness Edward Olsen. Edward Buist MD; 02/02/2014 11:58 AM) Patient words: hernia.       The patient is a 58 year old male who presents with an umbilical hernia. This is a 58 yo male who presents with a four year history of an enlarging bulge at his umbilicus. This has become mildly uncomfortable. Recently, the patient has noticed a few episodes of upper abdominal pain that tends to occur after eating. This spreads around his upper abdomen. He notices some abdominal bloating, but no nausea, vomiting, or diarrhea. Ultrasound was negative. No change in bowel habits.         CLINICAL DATA: Right upper quadrant pain EXAM: ULTRASOUND ABDOMEN COMPLETE COMPARISON: 03/29/2013 FINDINGS: Gallbladder: No gallstones or wall thickening visualized. No sonographic Murphy sign noted. Common bile duct: Diameter: 4.5 mm in diameter. Liver: No focal lesion identified. There is diffuse increased liver echogenicity suspicious for fatty infiltration. IVC: Not visualized due to bowel gas. Pancreas: Visualized portion unremarkable. Spleen: Size and appearance within normal limits. Measures 14.3 x 14.7 cm by 6 cm Right Kidney: Length: 11.8cm. Echogenicity within normal limits. No mass or hydronephrosis visualized. Left Kidney: Length: 12.6 cm. Echogenicity within normal limits. No mass or hydronephrosis visualized. Abdominal aorta: No aneurysm visualized. Measures up to 2.5 cm in diameter. Other findings: None. IMPRESSION: 1. No gallstones are noted within gallbladder. 2. Echogenic liver suspicious for fatty infiltration. 3. Splenomegaly is noted. The spleen measures 14.3 x 14.7 x 6 cm. Electronically Signed By: Edward Olsen M.D. On: 01/05/2014 15:12  AST mildly elevated at 40 T bili  normal   CLINICAL DATA: Generalized postprandial abdominal pain  EXAM: NUCLEAR MEDICINE HEPATOBILIARY IMAGING WITH GALLBLADDER EF  TECHNIQUE: Sequential images of the abdomen were obtained out to 60 minutes following intravenous administration of radiopharmaceutical. After slow intravenous infusion of 2.5 micrograms Cholecystokinin, gallbladder ejection fraction was determined.  RADIOPHARMACEUTICALS: 5.0 Millicurie ZO-[id number] Choletec  COMPARISON: Ultrasound 01/05/2014  FINDINGS: There is normal uptake of the tracer by the liver. Gallbladder and CBD visualized at 10 min. Bowel activity noted at 15 min. Post CCK gallbladder ejection fraction is 89%. At 30 min, normal ejection fraction is greater than 30%.  The patient did not experience symptoms during CCK infusion.  IMPRESSION: No cystic duct obstruction. No CBD obstruction. Post CCK gallbladder ejection fraction is 89%. Normal gallbladder ejection fraction should be greater than 30%   Electronically Signed By: Edward Olsen M.D. On: 02/23/2014 11:17   Other Problems ([place], [place]; 02/02/2014 10:00 AM) Diabetes Mellitus High blood pressure Sleep Apnea Thyroid Disease     Past Surgical History ([place], [place]; 02/02/2014 10:00 AM) Colon Polyp Removal - Colonoscopy Hemorrhoidectomy Oral Surgery     Diagnostic Studies History ([place], [place]; 02/02/2014 10:00 AM) Colonoscopy within last year    Allergies [facility] [place], [place]; 02/02/2014 9:03 AM) No Known Drug Allergies 02/02/2014     Medication History (Edward Olsen, RMA; 02/02/2014 10:01 AM) Armour Thyroid (180MG  Tablet, Oral) Active. Aspirin (81MG  Tablet, 1 (one) Oral) Active. Vibramycin (100MG  Capsule, Oral) Active. Tricor (145MG  Tablet, Oral) Active. Lasix (40MG  Tablet, Oral) Active. Amaryl (2MG  Tablet, Oral) Active. Avapro (75MG  Tablet, Oral) Active. Naproxen Sodium (550MG  Tablet, Oral)  Active. Mirapex (1MG  Tablet, Oral) Active. Crestor (10MG  Tablet, Oral) Active. Januvia (50MG  Tablet, Oral) Active. Medications Reconciled    Social History (  Edward Olsen, RMA; 02/02/2014 10:00 AM) Alcohol use Moderate alcohol use. Caffeine use Carbonated beverages, Coffee. No drug use Tobacco use Former smoker.    Family History Edward Olsen [place], [place]; 02/02/2014 10:00 AM) Alcohol Abuse Mother. Breast Cancer Mother. Diabetes Mellitus Mother. Heart Disease Father. Heart disease in male family member before age 72 Hypertension Father.     Review of Systems ([place] RMA; 02/02/2014 10:00 AM) General Present- Weight Gain. Not Present- Appetite Loss, Chills, Fatigue, Fever, Night Sweats and Weight Loss. HEENT Present- Hearing Loss and Wears glasses/contact lenses. Not Present- Earache, Hoarseness, Nose Bleed, Oral Ulcers, Ringing in the Ears, Seasonal Allergies, Sinus Pain, Sore Throat, Visual Disturbances and Yellow Eyes. Respiratory Present- Snoring and Wheezing. Not Present- Bloody sputum, Chronic Cough and Difficulty Breathing. Cardiovascular Present- Shortness of Breath and Swelling of Extremities. Not Present- Chest Pain, Difficulty Breathing Lying Down, Leg Cramps, Palpitations and Rapid Heart Rate. Gastrointestinal Present- Abdominal Pain and Excessive gas. Not Present- Bloating, Bloody Stool, Change in Bowel Habits, Chronic diarrhea, Constipation, Difficulty Swallowing, Gets full quickly at meals, Hemorrhoids, Indigestion, Nausea, Rectal Pain and Vomiting. Male Genitourinary Present- Frequency. Not Present- Blood in Urine, Change in Urinary Stream, Impotence, Nocturia, Painful Urination, Urgency and Urine Leakage. Musculoskeletal Present- Muscle Weakness. Not Present- Back Pain, Joint Pain, Joint Stiffness, Muscle Pain and Swelling of Extremities. Neurological Present- Tingling. Not Present- Decreased Memory, Fainting, Headaches, Numbness,  Seizures, Tremor, Trouble walking and Weakness. Psychiatric Present- Frequent crying. Not Present- Anxiety, Bipolar, Change in Sleep Pattern, Depression and Fearful. Endocrine Present- New Diabetes. Not Present- Cold Intolerance, Excessive Hunger, Hair Changes, Heat Intolerance and Hot flashes. Hematology Present- Easy Bruising. Not Present- Excessive bleeding, Gland problems, HIV and Persistent Infections.     Vitals (Edward Olsen RMA; 02/02/2014 10:02 AM) 02/02/2014 10:01 AM Weight: 278.4 lb Height: 71 in  Body Surface Area: 2.52 m Body Mass Index: 38.83 kg/m Temp.: 98.3 F (Oral) Pulse: 91 (Regular) P.OX: 97% (Room air) BP: 138/62 (Sitting, Left Arm, Standard)      Physical Exam Edward Key K. Kail Fraley MD; 02/02/2014 11:58 AM) The physical exam findings are as follows: Note: WDWN in NAD HEENT: EOMI, sclera anicteric Neck: No masses, no thyromegaly Lungs: CTA bilaterally; normal respiratory effort CV: Regular rate and rhythm; no murmurs Abd: +bowel sounds, soft, no RUQ tenderness; protruding umbilical hernia - reducible Skin lesion - left upper abdominal wall - pigmented asymmetric; 1 cm Ext: Well-perfused; no edema Skin: Warm, dry; no sign of jaundice    Assessment & Plan Edward Key K. Bethaney Oshana MD; 51/11/6158 73:71 PM) UMBILICAL HERNIA (G62.6) UMBILICAL HERNIA WITHOUT OBSTRUCTION AND WITHOUT GANGRENE (R48.5)    Umbilical hernia - reducible Pigmented skin lesion - abdominal wall - 1 cm        Vitals Briant Cedar CMA; 03/06/2014 9:08 AM) 03/06/2014 9:07 AM Weight: 283 lb Height: 71in Body Surface Area: 2.54 m Body Mass Index: 39.47 kg/m Temp.: 98.69F  Pulse: 78 (Regular)  BP: 140/80 (Sitting, Left Arm, Standard)    Assessment & Plan Edward Key K. Naheim Burgen MD; 46/06/7033 0:09 AM) UMBILICAL HERNIA WITHOUT OBSTRUCTION AND WITHOUT GANGRENE (553.1  K42.9) Current Plans  Schedule for Surgery - umbilical hernia repair with mesh; excision of  skin lesion abdominal wall.  The surgical procedure has been discussed with the patient. Potential risks, benefits, alternative treatments, and expected outcomes have been explained. All of the patient's questions at this time have been answered. The likelihood of reaching the patient's treatment goal is good. The patient understand the proposed surgical procedure and wishes to proceed.  PIGMENTED SKIN LESION OF UNCERTAIN NATURE (606.77  L81.9)   Edward Olsen. Georgette Dover, MD, [facility]  General/ Trauma Surgery  03/06/2014 9:43 AM

## 2014-03-07 ENCOUNTER — Telehealth: Payer: Self-pay | Admitting: Family Medicine

## 2014-03-07 MED ORDER — IRBESARTAN 75 MG PO TABS: 75.0000 mg | ORAL_TABLET | Freq: Every day | ORAL | Status: DC

## 2014-03-07 NOTE — Telephone Encounter (Signed)
[facility] [place] is requesting re-fill on irbesartan (AVAPRO) 75 MG tablet

## 2014-03-07 NOTE — Telephone Encounter (Signed)
I sent script e-scribe. 

## 2014-04-03 ENCOUNTER — Other Ambulatory Visit (INDEPENDENT_AMBULATORY_CARE_PROVIDER_SITE_OTHER): Payer: Self-pay | Admitting: Surgery

## 2014-04-10 ENCOUNTER — Telehealth (INDEPENDENT_AMBULATORY_CARE_PROVIDER_SITE_OTHER): Payer: Self-pay

## 2014-04-10 NOTE — Telephone Encounter (Signed)
Pt called this am about returning to work. Pt s/p umbilical hernia on 42/5/52 by Dr Georgette Dover. Pts disability form is has him returning to work on 05/07/14. Pt states that he would like to go back to work on 04/16/14. Pt states that he works for [employer] and does some lifting. Informed pt that I would send Dr Georgette Dover a message and I would contact him as soon as I received a response. Pt verbalized understanding.

## 2014-04-13 ENCOUNTER — Telehealth: Payer: Self-pay | Admitting: Family Medicine

## 2014-04-13 DIAGNOSIS — G4733 Obstructive sleep apnea (adult) (pediatric): Secondary | ICD-10-CM

## 2014-04-13 NOTE — Telephone Encounter (Signed)
Pt would like a new , more comfortable CPAP mask, and the company he originally got his from has gone out of business. The sleep study company could not be contacted, will not answer or out of business. Pt would like to know what he needs to do in order to get a new mask  Pt would like the mask Respironics / a "dream wear nasal mask".  Pt would like to know how to go about getting this or if dr fry can order for him. pls advise

## 2014-04-16 ENCOUNTER — Encounter: Payer: Self-pay | Admitting: Family Medicine

## 2014-04-16 DIAGNOSIS — G4733 Obstructive sleep apnea (adult) (pediatric): Secondary | ICD-10-CM

## 2014-04-16 NOTE — Telephone Encounter (Signed)
I left a voice message for pt. Dr. Sarajane Jews does not order these machines or supplies. Pt will need to see pulmonary for this, and he will need to call us back if he wants a referral.

## 2014-04-17 NOTE — Telephone Encounter (Signed)
Pt would like a referral to pulmonologist

## 2014-04-17 NOTE — Telephone Encounter (Signed)
Left message on voicemail to call office on home and mobile numbers.

## 2014-04-18 NOTE — Telephone Encounter (Signed)
The referral was done  

## 2014-04-18 NOTE — Telephone Encounter (Signed)
I left a voice message for pt with below information.  

## 2014-04-19 NOTE — Telephone Encounter (Signed)
In that case he will need to see a new sleep doctor. I will refer him to Dr. Gwenette Greet and he can take care of everything

## 2014-04-21 ENCOUNTER — Other Ambulatory Visit: Payer: Self-pay | Admitting: Family Medicine

## 2014-04-23 NOTE — Telephone Encounter (Signed)
Refill request is too early.  Filled for 1 year 06/2013

## 2014-05-28 ENCOUNTER — Institutional Professional Consult (permissible substitution): Payer: BC Managed Care – PPO | Admitting: Pulmonary Disease

## 2014-06-08 ENCOUNTER — Other Ambulatory Visit: Payer: Self-pay | Admitting: Family Medicine

## 2014-06-10 ENCOUNTER — Other Ambulatory Visit: Payer: Self-pay | Admitting: Family Medicine

## 2014-06-21 ENCOUNTER — Other Ambulatory Visit: Payer: Self-pay | Admitting: Family Medicine

## 2014-06-26 ENCOUNTER — Ambulatory Visit (INDEPENDENT_AMBULATORY_CARE_PROVIDER_SITE_OTHER): Payer: BLUE CROSS/BLUE SHIELD | Admitting: Family Medicine

## 2014-06-26 ENCOUNTER — Encounter: Payer: Self-pay | Admitting: Family Medicine

## 2014-06-26 VITALS — BP 136/78 | HR 73 | Temp 98.3°F | Ht 69.5 in | Wt 293.0 lb

## 2014-06-26 DIAGNOSIS — E669 Obesity, unspecified: Secondary | ICD-10-CM

## 2014-06-26 DIAGNOSIS — E119 Type 2 diabetes mellitus without complications: Secondary | ICD-10-CM

## 2014-06-26 DIAGNOSIS — I10 Essential (primary) hypertension: Secondary | ICD-10-CM

## 2014-06-26 LAB — HEMOGLOBIN A1C: HEMOGLOBIN A1C: 7.4 % — AB (ref 4.6–6.5)

## 2014-06-26 NOTE — Progress Notes (Signed)
   Subjective:    Patient ID: Edward Olsen, male    DOB: 1955-10-01, 59 y.o.   MRN: [id number]  HPI Here to follow up. He feels fine. He has not seen Dr. Chalmers Cater for about 6 months.    Review of Systems  Constitutional: Negative.   Respiratory: Negative.   Cardiovascular: Negative.        Objective:   Physical Exam  Constitutional: He appears well-developed and well-nourished.  Neck: No thyromegaly present.  Cardiovascular: Normal rate, regular rhythm, normal heart sounds and intact distal pulses.   Pulmonary/Chest: Effort normal and breath sounds normal.  Lymphadenopathy:    He has no cervical adenopathy.          Assessment & Plan:  He seems to be doing well. His HTN is well controlled. Get an A1c today. He is scheduled for a full cpx next month

## 2014-06-26 NOTE — Progress Notes (Signed)
Pre visit review using our clinic review tool, if applicable. No additional management support is needed unless otherwise documented below in the visit note. 

## 2014-07-10 ENCOUNTER — Other Ambulatory Visit: Payer: Self-pay

## 2014-07-16 ENCOUNTER — Encounter: Payer: Self-pay | Admitting: Family Medicine

## 2014-07-17 ENCOUNTER — Other Ambulatory Visit: Payer: Self-pay | Admitting: Family Medicine

## 2014-07-17 NOTE — Telephone Encounter (Signed)
Can we refill this? It is not on current medication list.

## 2014-07-25 ENCOUNTER — Other Ambulatory Visit: Payer: Self-pay | Admitting: Family Medicine

## 2014-08-29 ENCOUNTER — Ambulatory Visit (INDEPENDENT_AMBULATORY_CARE_PROVIDER_SITE_OTHER): Payer: BLUE CROSS/BLUE SHIELD | Admitting: Family Medicine

## 2014-08-29 ENCOUNTER — Encounter: Payer: Self-pay | Admitting: Family Medicine

## 2014-08-29 VITALS — BP 135/73 | HR 74 | Temp 98.1°F | Ht 69.5 in | Wt 290.0 lb

## 2014-08-29 DIAGNOSIS — W57XXXA Bitten or stung by nonvenomous insect and other nonvenomous arthropods, initial encounter: Secondary | ICD-10-CM | POA: Diagnosis not present

## 2014-08-29 DIAGNOSIS — E785 Hyperlipidemia, unspecified: Secondary | ICD-10-CM | POA: Diagnosis not present

## 2014-08-29 DIAGNOSIS — T148 Other injury of unspecified body region: Secondary | ICD-10-CM | POA: Diagnosis not present

## 2014-08-29 DIAGNOSIS — G2581 Restless legs syndrome: Secondary | ICD-10-CM | POA: Diagnosis not present

## 2014-08-29 LAB — LIPID PANEL
CHOL/HDL RATIO: 5
Cholesterol: 158 mg/dL (ref 0–200)
HDL: 29.8 mg/dL — ABNORMAL LOW (ref >39.00)
NONHDL: 128.2
Triglycerides: 243 mg/dL — ABNORMAL HIGH (ref 0.0–149.0)
VLDL: 48.6 mg/dL — ABNORMAL HIGH (ref 0.0–40.0)

## 2014-08-29 LAB — HEPATIC FUNCTION PANEL
ALBUMIN: 4.5 g/dL (ref 3.5–5.2)
ALT: 57 U/L — AB (ref 0–53)
AST: 38 U/L — ABNORMAL HIGH (ref 0–37)
Alkaline Phosphatase: 45 U/L (ref 39–117)
BILIRUBIN TOTAL: 0.6 mg/dL (ref 0.2–1.2)
Bilirubin, Direct: 0.1 mg/dL (ref 0.0–0.3)
Total Protein: 6.9 g/dL (ref 6.0–8.3)

## 2014-08-29 LAB — LDL CHOLESTEROL, DIRECT: LDL DIRECT: 100 mg/dL

## 2014-08-29 MED ORDER — PRAMIPEXOLE DIHYDROCHLORIDE 1 MG PO TABS: ORAL_TABLET | ORAL | Status: DC

## 2014-08-29 NOTE — Progress Notes (Signed)
   Subjective:    Patient ID: Edward Olsen, male    DOB: 1956/02/20, 59 y.o.   MRN: [id number]  HPI Here for several issues. First he found a tick on the left flank 3 days ago and went to Urgent Care for this. They said since he is already taking Doxycyline once a day that nothing else needs to be done. He feels fine except for some itching at the spot. He also mentions that he often bites his tongue at night when he first drifts off to sleep. His restless legs do not respond as well to the Mirapex as they used to. He currently takes a total of 3 mg qhs.    Review of Systems  Constitutional: Negative.   Respiratory: Negative.   Cardiovascular: Negative.   Skin: Positive for rash.       Objective:   Physical Exam  Constitutional: He appears well-developed and well-nourished.  Cardiovascular: Normal rate, regular rhythm, normal heart sounds and intact distal pulses.   Pulmonary/Chest: Effort normal and breath sounds normal.  Skin:  The left flank has an area of erythema and induration around a tiny bite mark.           Assessment & Plan:  For the tick bite, I asked him to increase the Doxycycline to BID for 7 days and then to go back to once a day after that. For the tongue biting we will increase the Mirapex to a total of 4 mg qhs and I suggested he see his dentist to make a bite guard for him to wear at night.

## 2014-08-29 NOTE — Progress Notes (Signed)
Pre visit review using our clinic review tool, if applicable. No additional management support is needed unless otherwise documented below in the visit note. 

## 2014-08-31 ENCOUNTER — Other Ambulatory Visit: Payer: Self-pay | Admitting: Family Medicine

## 2014-09-08 ENCOUNTER — Other Ambulatory Visit: Payer: Self-pay | Admitting: Family Medicine

## 2014-09-12 ENCOUNTER — Ambulatory Visit (INDEPENDENT_AMBULATORY_CARE_PROVIDER_SITE_OTHER): Payer: Self-pay | Admitting: Neurology

## 2014-09-12 ENCOUNTER — Ambulatory Visit (INDEPENDENT_AMBULATORY_CARE_PROVIDER_SITE_OTHER): Payer: No Typology Code available for payment source | Admitting: Neurology

## 2014-09-12 DIAGNOSIS — M25332 Other instability, left wrist: Secondary | ICD-10-CM | POA: Diagnosis not present

## 2014-09-12 DIAGNOSIS — M25832 Other specified joint disorders, left wrist: Secondary | ICD-10-CM

## 2014-09-12 DIAGNOSIS — G5602 Carpal tunnel syndrome, left upper limb: Secondary | ICD-10-CM | POA: Diagnosis not present

## 2014-09-13 NOTE — Progress Notes (Signed)
See procedure note.

## 2014-09-16 NOTE — Progress Notes (Signed)
  [facility]    Provider:  Dr Jaynee Eagles Referring Provider: Laurey Morale, MD Primary Care Physician:  Laurey Morale, MD   HPI:  Edward Olsen is a 59 y.o. male here as a referral from Dr. Sarajane Jews for evaluation of ulnar neuropathy at [facility] canal vs proximal compression.  Summary: Nerve Conduction studies were performed on the left upper extremities.   Median APB motor conduction showed delayed distal onset latency (4.9 mV,N<4) with normal F wave latency. Left Ulnar ADM motor conduction showed delayed distal onset latency (5.5 ms,N<4.2) and reduced amplitude(0.87mV, N>5)  Left Ulnar FDI motor conduction was within normal limits The left ADM Ulnar F wave showed no response There is a 70% drop in amplitude of the ulnar motor potential across the wrist segment as compared to the below wrist segment (Guyon's canal) Radial sensory conduction was within normal limits. Median sensory nerve showed delayed distal peak latency(4.16ms,N<3.9) Ulnar sensory nerve showed delayed distal peak latency(3.64ms,N<3.5) and reduced amplitude (3uV,N>10) Dorsal ulnar cutaneous sensory nerve was within normal limits. Left lumbrical/interossei comparison study comparing the ulnar interossei with the median second lumbrical, stimulating nerves at the same distance, did not show a difference with the ulnar delay (however this study is inconclusive since there is a coexistent median neuropathy at the wrist).   Needle evaluation left ADM  muscle showed markedly increased spontaneous activity(positive waves and fibrillations), prolonged motor unit duration and diminished motor unit recruitment. The First Dorsal Interosseous showed markedly increased spontaneous activity(positive waves), increased motor unit amplitude and diminished motor unit recruitment.The following left upper extremity muscles were within normal limits: Deltoid, Triceps, Biceps, Extensor Indices, Flexor Digitorum Profundus, Flexor Carpi  Ulnaris, Opponens Pollicis.   Conclusion:  1. Conductions revealed conduction block of the ulnar motor potential across the left wrist. 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 No suggestion of cervical radiculopathy. 2. 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   Sarina Ill, MD  [facility] [address] [place], [place] [address]  Phone [phone] Fax [phone]

## 2014-09-18 NOTE — Procedures (Signed)
[facility]    Provider:  Dr Jaynee Eagles Referring Provider: Laurey Morale, MD Primary Care Physician:  Laurey Morale, MD   HPI:  Edward Olsen is a 59 y.o. male here as a referral from Dr. Sarajane Jews for evaluation of ulnar neuropathy at [facility] canal vs proximal compression.  Summary: Nerve Conduction studies were performed on the left upper extremities.   Median APB motor conduction showed delayed distal onset latency (4.9 mV,N<4) with normal F wave latency. Left Ulnar ADM motor conduction showed delayed distal onset latency (5.5 ms,N<4.2) and reduced amplitude(0.68mV, N>5)  Left Ulnar FDI motor conduction was within normal limits The left ADM Ulnar F wave showed no response There is a 70% drop in amplitude of the ulnar motor potential across the wrist segment as compared to the below wrist segment (Guyon's canal) Radial sensory conduction was within normal limits. Median sensory nerve showed delayed distal peak latency(4.3ms,N<3.9) Ulnar sensory nerve showed delayed distal peak latency(3.34ms,N<3.5) and reduced amplitude (3uV,N>10) Dorsal ulnar cutaneous sensory nerve was within normal limits. Left lumbrical/interossei comparison study comparing the ulnar interossei with the median second lumbrical, stimulating nerves at the same distance, did not show a difference with the ulnar delay (however this study is inconclusive since there is a coexistent median neuropathy at the wrist).   Needle evaluation left ADM  muscle showed markedly increased spontaneous activity(positive waves and fibrillations), prolonged motor unit duration and diminished motor unit recruitment. The First Dorsal Interosseous showed markedly increased spontaneous activity(positive waves), increased motor unit amplitude and diminished motor unit recruitment.The following left upper extremity muscles were within normal limits: Deltoid, Triceps, Biceps, Extensor Indices, Flexor Digitorum Profundus, Flexor Carpi  Ulnaris, Opponens Pollicis.   Conclusion:  1. Conductions revealed conduction block of the ulnar motor potential across the left wrist. 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 No suggestion of cervical radiculopathy. 2. 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   Sarina Ill, MD  [facility] [address] [place], [place] [address]  Phone [phone] Fax [phone]

## 2014-09-26 ENCOUNTER — Encounter: Payer: BLUE CROSS/BLUE SHIELD | Admitting: Family Medicine

## 2014-10-10 ENCOUNTER — Encounter: Payer: BLUE CROSS/BLUE SHIELD | Admitting: Family Medicine

## 2014-10-10 ENCOUNTER — Other Ambulatory Visit: Payer: Self-pay | Admitting: Family Medicine

## 2014-10-10 MED ORDER — ROSUVASTATIN CALCIUM 10 MG PO TABS: ORAL_TABLET | ORAL | Status: DC

## 2014-10-10 NOTE — Telephone Encounter (Signed)
Done

## 2014-10-10 NOTE — Telephone Encounter (Signed)
Pt request refill CRESTOR 10 MG tablet 90 day Pt in between insurance companies and does not use [facility] anymore.   Needs  rx [facility]

## 2014-10-18 ENCOUNTER — Ambulatory Visit: Payer: BLUE CROSS/BLUE SHIELD | Admitting: *Deleted

## 2014-10-28 ENCOUNTER — Other Ambulatory Visit: Payer: Self-pay | Admitting: Family Medicine

## 2014-10-29 ENCOUNTER — Telehealth: Payer: Self-pay | Admitting: Family Medicine

## 2014-10-29 ENCOUNTER — Other Ambulatory Visit: Payer: Self-pay

## 2014-10-29 NOTE — Telephone Encounter (Signed)
fenofibrate (TRICOR) 145 MG tablet, sitaGLIPtin (JANUVIA) 50 MG tablet, naproxen sodium (ANAPROX) 550 MG tablet for knee pain, furosemide (LASIX) 40 MG tablet , irbesartan (AVAPRO) 75 MG tablet, glimepiride (AMARYL) 2 MG tablet, patient would like the above medications sent to:  [id number] [place], [place] [phone] (Phone) [phone] (Fax)

## 2014-10-31 NOTE — Telephone Encounter (Signed)
Dr Omar Person do not see where you have prescribed Furosemide, Glimepiride or Naproxen for this pt.

## 2014-11-01 MED ORDER — NAPROXEN SODIUM 550 MG PO TABS: 550.0000 mg | ORAL_TABLET | Freq: Two times a day (BID) | ORAL | Status: DC

## 2014-11-01 MED ORDER — FENOFIBRATE 145 MG PO TABS: 145.0000 mg | ORAL_TABLET | Freq: Every day | ORAL | Status: DC

## 2014-11-01 MED ORDER — GLIMEPIRIDE 2 MG PO TABS: 2.0000 mg | ORAL_TABLET | Freq: Three times a day (TID) | ORAL | Status: DC

## 2014-11-01 MED ORDER — SITAGLIPTIN PHOSPHATE 50 MG PO TABS: 50.0000 mg | ORAL_TABLET | Freq: Every day | ORAL | Status: DC

## 2014-11-01 MED ORDER — FUROSEMIDE 40 MG PO TABS: 40.0000 mg | ORAL_TABLET | ORAL | Status: DC

## 2014-11-01 MED ORDER — IRBESARTAN 75 MG PO TABS: 75.0000 mg | ORAL_TABLET | Freq: Every day | ORAL | Status: DC

## 2014-11-01 NOTE — Telephone Encounter (Signed)
Refills sent

## 2014-11-01 NOTE — Telephone Encounter (Signed)
Please refill all these

## 2014-11-09 ENCOUNTER — Telehealth: Payer: Self-pay | Admitting: Family Medicine

## 2014-11-09 NOTE — Telephone Encounter (Signed)
Pt needs new rx doxycycline 100 mg #90 [facility]. Pt is aware md out of office today

## 2014-11-12 MED ORDER — DOXYCYCLINE HYCLATE 100 MG PO CAPS: 100.0000 mg | ORAL_CAPSULE | Freq: Every day | ORAL | Status: DC

## 2014-11-12 NOTE — Telephone Encounter (Signed)
I sent script e-scribe. 

## 2014-11-12 NOTE — Telephone Encounter (Signed)
Call in #90 with 3 rf  

## 2014-11-13 ENCOUNTER — Telehealth: Payer: Self-pay | Admitting: Family Medicine

## 2014-11-13 NOTE — Telephone Encounter (Signed)
Pt request refill of the following: glucose blood (ONE TOUCH ULTRA TEST) test strip   Pt req 90 day supply   Phamacy:  [facility]

## 2014-11-14 MED ORDER — GLUCOSE BLOOD VI STRP: ORAL_STRIP | Status: DC

## 2014-11-14 NOTE — Telephone Encounter (Signed)
Script was sent e-scribe 

## 2014-11-15 ENCOUNTER — Telehealth: Payer: Self-pay | Admitting: Family Medicine

## 2014-11-15 NOTE — Telephone Encounter (Signed)
Pt states he has increased his pramipexole (MIRAPEX) 1 MG tablet to 5 /day The last rx dr fry wrote was for 3 /day, but pt states that did not work. 5 seems to be working good.  [facility]

## 2014-11-15 NOTE — Telephone Encounter (Signed)
Pt states his glucose blood (ONE TOUCH ULTRA TEST) test strip was changed to 3 X day testing. This current rx is for only 30 day @ 3  /day/  Can you resend?

## 2014-11-16 NOTE — Telephone Encounter (Signed)
Please refill the Mirapex for 6 months to take FIVE tabs a day, also refill the test strips

## 2014-11-19 MED ORDER — GLUCOSE BLOOD VI STRP: ORAL_STRIP | Status: DC

## 2014-11-19 MED ORDER — PRAMIPEXOLE DIHYDROCHLORIDE 1 MG PO TABS: ORAL_TABLET | ORAL | Status: DC

## 2014-11-19 NOTE — Telephone Encounter (Signed)
I sent both scripts e-scribe. 

## 2014-11-30 ENCOUNTER — Other Ambulatory Visit: Payer: Self-pay | Admitting: Family Medicine

## 2014-12-10 ENCOUNTER — Other Ambulatory Visit: Payer: Self-pay | Admitting: Family Medicine

## 2014-12-10 NOTE — Telephone Encounter (Signed)
Refill request for Pramipexole, Doxycycl, Naproxen, Fenofibrate and a 90 day supply to [facility].

## 2014-12-14 MED ORDER — NAPROXEN SODIUM 550 MG PO TABS: 550.0000 mg | ORAL_TABLET | Freq: Two times a day (BID) | ORAL | Status: DC

## 2014-12-14 MED ORDER — FENOFIBRATE 145 MG PO TABS: 145.0000 mg | ORAL_TABLET | Freq: Every day | ORAL | Status: DC

## 2014-12-14 MED ORDER — DOXYCYCLINE HYCLATE 100 MG PO CAPS: 100.0000 mg | ORAL_CAPSULE | Freq: Every day | ORAL | Status: DC

## 2014-12-14 MED ORDER — PRAMIPEXOLE DIHYDROCHLORIDE 1 MG PO TABS: ORAL_TABLET | ORAL | Status: DC

## 2014-12-14 NOTE — Telephone Encounter (Signed)
I sent all 4 scripts e-scribe to mail order pharmacy.

## 2015-02-05 ENCOUNTER — Telehealth: Payer: Self-pay | Admitting: Family Medicine

## 2015-02-05 NOTE — Telephone Encounter (Signed)
Pt states it would be a lot less expensive if he could switch to generic CRESTOR 10 MG tablet [[id number]]  Pt states if ok with you , he would like rx  rosuvastatin (CRESTOR) 10 MG tablet  [facility]  [facility]

## 2015-02-05 NOTE — Telephone Encounter (Signed)
Call in generic Rosuvastatin 10 mg a day for one year

## 2015-02-08 LAB — HM DIABETES EYE EXAM

## 2015-02-08 MED ORDER — ROSUVASTATIN CALCIUM 10 MG PO TABS: 10.0000 mg | ORAL_TABLET | Freq: Every day | ORAL | Status: DC

## 2015-02-08 NOTE — Telephone Encounter (Signed)
I sent script e-scribe, tried to reach pt and no answer or option to leave a message.

## 2015-04-01 ENCOUNTER — Telehealth: Payer: Self-pay

## 2015-04-01 NOTE — Telephone Encounter (Signed)
Pt request: DOXYCYCLINE HYCLATE 1MG   Pt last visit 08/29/14  Last Rx refill 12/14/14 #90 No refills  PRAMIPEXOLE DIHYDROCHLORIDE 1MG   Last Rx refill 12/14/14 # 450 No refills  PLEASE ADVISE

## 2015-04-02 MED ORDER — PRAMIPEXOLE DIHYDROCHLORIDE 1 MG PO TABS: ORAL_TABLET | ORAL | Status: DC

## 2015-04-02 MED ORDER — DOXYCYCLINE HYCLATE 100 MG PO CAPS: 100.0000 mg | ORAL_CAPSULE | Freq: Every day | ORAL | Status: DC

## 2015-04-02 NOTE — Telephone Encounter (Signed)
Call in Doxycycline 100 mg, #90 with one refill, also Pramipexole #450 with one rf

## 2015-04-03 NOTE — Telephone Encounter (Signed)
Rx was sent  

## 2015-04-30 ENCOUNTER — Other Ambulatory Visit: Payer: Self-pay | Admitting: Family Medicine

## 2015-06-30 ENCOUNTER — Other Ambulatory Visit: Payer: Self-pay | Admitting: Family Medicine

## 2015-07-08 ENCOUNTER — Other Ambulatory Visit: Payer: Self-pay | Admitting: Family Medicine

## 2015-07-29 ENCOUNTER — Other Ambulatory Visit: Payer: Self-pay | Admitting: Family Medicine

## 2015-07-30 NOTE — Telephone Encounter (Signed)
Can we refill this? 

## 2015-10-04 ENCOUNTER — Other Ambulatory Visit: Payer: Self-pay | Admitting: Family Medicine

## 2015-10-07 ENCOUNTER — Other Ambulatory Visit: Payer: Self-pay | Admitting: Family Medicine

## 2015-10-21 ENCOUNTER — Other Ambulatory Visit: Payer: Self-pay | Admitting: Family Medicine

## 2015-11-04 ENCOUNTER — Other Ambulatory Visit: Payer: Self-pay | Admitting: Family Medicine

## 2015-11-04 NOTE — Telephone Encounter (Signed)
Refill sent to pharmacy.   

## 2015-12-13 ENCOUNTER — Ambulatory Visit (INDEPENDENT_AMBULATORY_CARE_PROVIDER_SITE_OTHER): Payer: No Typology Code available for payment source | Admitting: Family Medicine

## 2015-12-13 ENCOUNTER — Encounter: Payer: Self-pay | Admitting: Family Medicine

## 2015-12-13 VITALS — BP 131/72 | HR 79 | Temp 98.5°F | Ht 69.5 in | Wt 292.0 lb

## 2015-12-13 DIAGNOSIS — E785 Hyperlipidemia, unspecified: Secondary | ICD-10-CM | POA: Diagnosis not present

## 2015-12-13 DIAGNOSIS — E119 Type 2 diabetes mellitus without complications: Secondary | ICD-10-CM | POA: Diagnosis not present

## 2015-12-13 DIAGNOSIS — Z0000 Encounter for general adult medical examination without abnormal findings: Secondary | ICD-10-CM

## 2015-12-13 LAB — BASIC METABOLIC PANEL
BUN: 18 mg/dL (ref 7–25)
CALCIUM: 9.9 mg/dL (ref 8.6–10.3)
CO2: 25 mmol/L (ref 20–31)
Chloride: 104 mmol/L (ref 98–110)
Creat: 1.1 mg/dL (ref 0.70–1.25)
GLUCOSE: 176 mg/dL — AB (ref 65–99)
POTASSIUM: 4.2 mmol/L (ref 3.5–5.3)
SODIUM: 141 mmol/L (ref 135–146)

## 2015-12-13 LAB — HEPATIC FUNCTION PANEL
ALBUMIN: 4.7 g/dL (ref 3.6–5.1)
ALT: 70 U/L — ABNORMAL HIGH (ref 9–46)
AST: 42 U/L — ABNORMAL HIGH (ref 10–35)
Alkaline Phosphatase: 52 U/L (ref 40–115)
BILIRUBIN TOTAL: 0.5 mg/dL (ref 0.2–1.2)
Bilirubin, Direct: 0.1 mg/dL (ref <=0.2)
Indirect Bilirubin: 0.4 mg/dL (ref 0.2–1.2)
TOTAL PROTEIN: 6.9 g/dL (ref 6.1–8.1)

## 2015-12-13 LAB — PSA: PSA: 0.3 ng/mL (ref <=4.0)

## 2015-12-13 LAB — LIPID PANEL
CHOL/HDL RATIO: 5 ratio (ref <=5.0)
Cholesterol: 155 mg/dL (ref 125–200)
HDL: 31 mg/dL — AB (ref >=40)
LDL CALC: 71 mg/dL (ref <130)
Triglycerides: 264 mg/dL — ABNORMAL HIGH (ref <150)
VLDL: 53 mg/dL — AB (ref <30)

## 2015-12-13 LAB — POC URINALSYSI DIPSTICK (AUTOMATED)
BILIRUBIN UA: NEGATIVE
Blood, UA: NEGATIVE
GLUCOSE UA: NEGATIVE
KETONES UA: NEGATIVE
LEUKOCYTES UA: NEGATIVE
Nitrite, UA: NEGATIVE
PROTEIN UA: NEGATIVE
SPEC GRAV UA: 1.01
Urobilinogen, UA: 0.2
pH, UA: 5

## 2015-12-13 LAB — TSH: TSH: 1.01 m[IU]/L (ref 0.40–4.50)

## 2015-12-13 LAB — HEMOGLOBIN A1C
HEMOGLOBIN A1C: 7.4 % — AB (ref <5.7)
Mean Plasma Glucose: 166 mg/dL

## 2015-12-13 NOTE — Progress Notes (Signed)
Pre visit review using our clinic review tool, if applicable. No additional management support is needed unless otherwise documented below in the visit note. 

## 2015-12-13 NOTE — Progress Notes (Signed)
   Subjective:    Patient ID: Edward Olsen, male    DOB: [birth year], 60 y.o.   MRN: [id number]  HPI 60 yr old male for a well exam. He feels well but realizes he is overweight. He is thinking about trying Weight [employer].    Review of Systems  Constitutional: Negative.   HENT: Negative.   Eyes: Negative.   Respiratory: Negative.   Cardiovascular: Negative.   Gastrointestinal: Negative.   Genitourinary: Negative.   Musculoskeletal: Negative.   Skin: Negative.   Neurological: Negative.   Psychiatric/Behavioral: Negative.        Objective:   Physical Exam  Constitutional: He is oriented to person, place, and time. No distress.  Very overweight   HENT:  Head: Normocephalic and atraumatic.  Right Ear: External ear normal.  Left Ear: External ear normal.  Nose: Nose normal.  Mouth/Throat: Oropharynx is clear and moist. No oropharyngeal exudate.  Eyes: Conjunctivae and EOM are normal. Pupils are equal, round, and reactive to light. Right eye exhibits no discharge. Left eye exhibits no discharge. No scleral icterus.  Neck: Neck supple. No JVD present. No tracheal deviation present. No thyromegaly present.  Cardiovascular: Normal rate, regular rhythm, normal heart sounds and intact distal pulses.  Exam reveals no gallop and no friction rub.   No murmur heard. EKG normal   Pulmonary/Chest: Effort normal and breath sounds normal. No respiratory distress. He has no wheezes. He has no rales. He exhibits no tenderness.  Abdominal: Soft. Bowel sounds are normal. He exhibits no distension and no mass. There is no tenderness. There is no rebound and no guarding.  Genitourinary: Rectum normal, prostate normal and penis normal. Rectal exam shows guaiac negative stool. No penile tenderness.  Musculoskeletal: Normal range of motion. He exhibits no edema or tenderness.  Lymphadenopathy:    He has no cervical adenopathy.  Neurological: He is alert and oriented to person, place, and time. He  has normal reflexes. No cranial nerve deficit. He exhibits normal muscle tone. Coordination normal.  Skin: Skin is warm and dry. No rash noted. He is not diaphoretic. No erythema. No pallor.  Psychiatric: He has a normal mood and affect. His behavior is normal. Judgment and thought content normal.          Assessment & Plan:  Well exam. We discussed diet and exercise. Get labs. Laurey Morale, MD

## 2015-12-14 LAB — CBC WITH DIFFERENTIAL/PLATELET
BASOS PCT: 0 %
Basophils Absolute: 0 cells/uL (ref 0–200)
EOS ABS: 135 {cells}/uL (ref 15–500)
EOS PCT: 3 %
HCT: 43 % (ref 38.5–50.0)
Hemoglobin: 14.1 g/dL (ref 13.2–17.1)
LYMPHS PCT: 29 %
Lymphs Abs: 1305 cells/uL (ref 850–3900)
MCH: 30.3 pg (ref 27.0–33.0)
MCHC: 32.8 g/dL (ref 32.0–36.0)
MCV: 92.5 fL (ref 80.0–100.0)
MONOS PCT: 9 %
MPV: 10.3 fL (ref 7.5–12.5)
Monocytes Absolute: 405 cells/uL (ref 200–950)
NEUTROS ABS: 2655 {cells}/uL (ref 1500–7800)
Neutrophils Relative %: 59 %
PLATELETS: 161 10*3/uL (ref 140–400)
RBC: 4.65 MIL/uL (ref 4.20–5.80)
RDW: 14.8 % (ref 11.0–15.0)
WBC: 4.5 10*3/uL (ref 3.8–10.8)

## 2015-12-21 ENCOUNTER — Other Ambulatory Visit: Payer: Self-pay | Admitting: Family Medicine

## 2016-01-19 ENCOUNTER — Other Ambulatory Visit: Payer: Self-pay | Admitting: Family Medicine

## 2016-01-24 ENCOUNTER — Other Ambulatory Visit: Payer: Self-pay | Admitting: Family Medicine

## 2016-01-29 ENCOUNTER — Other Ambulatory Visit: Payer: Self-pay | Admitting: Family Medicine

## 2016-02-21 ENCOUNTER — Encounter: Payer: Self-pay | Admitting: Family Medicine

## 2016-02-21 LAB — HM DIABETES EYE EXAM

## 2016-03-23 ENCOUNTER — Other Ambulatory Visit: Payer: Self-pay | Admitting: Family Medicine

## 2016-04-19 IMAGING — US US ABDOMEN COMPLETE
1 series · 8 of 8 positions shown · non-contrast
Comparison: 03/29/2013

CLINICAL DATA: Right upper quadrant pain

EXAM:
ULTRASOUND ABDOMEN COMPLETE

[Series 1: us abdomen complete · 0.39mm/px · 8 of 8 slices shown]
[im 1/8]
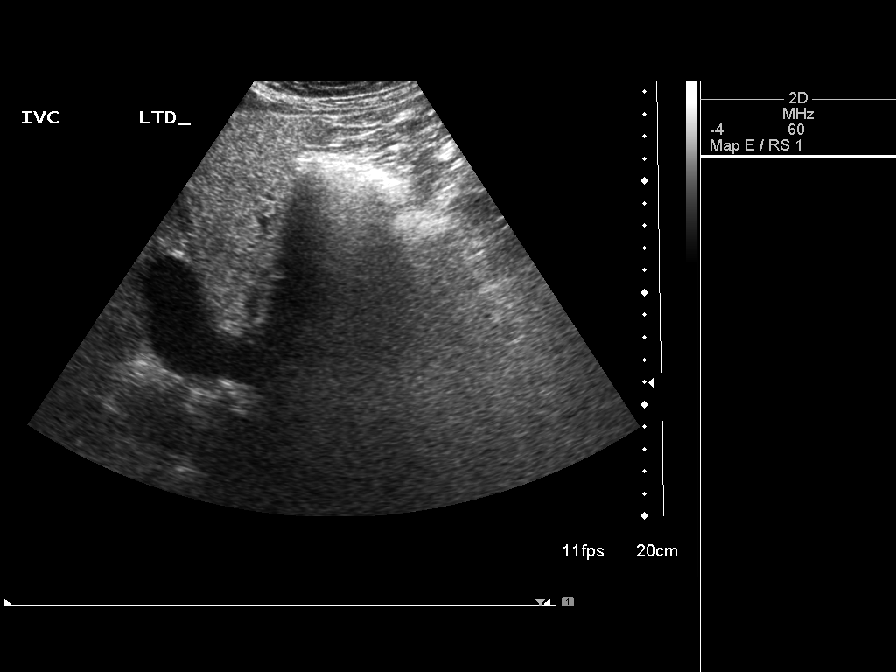
[im 2/8]
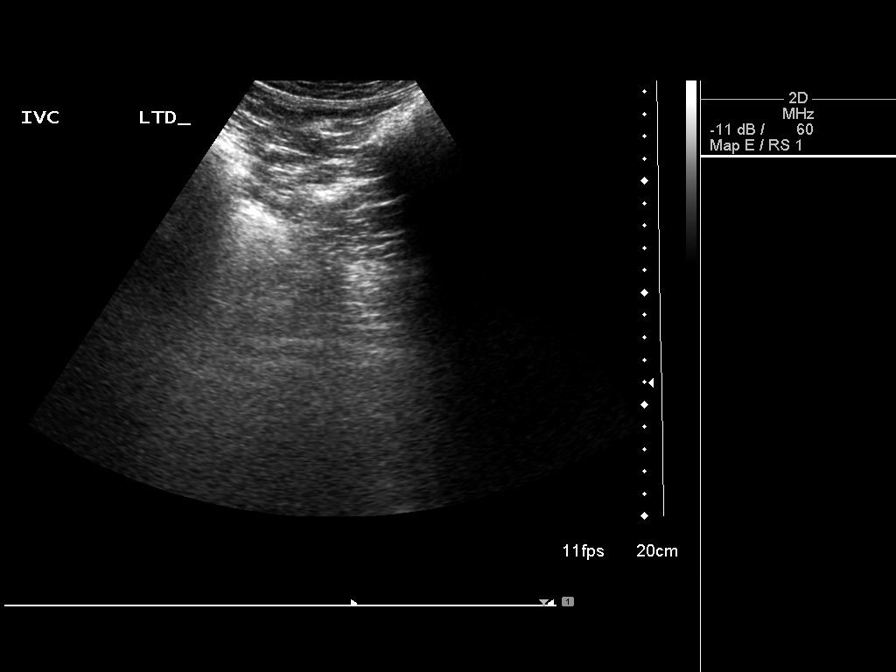
[im 3/8]
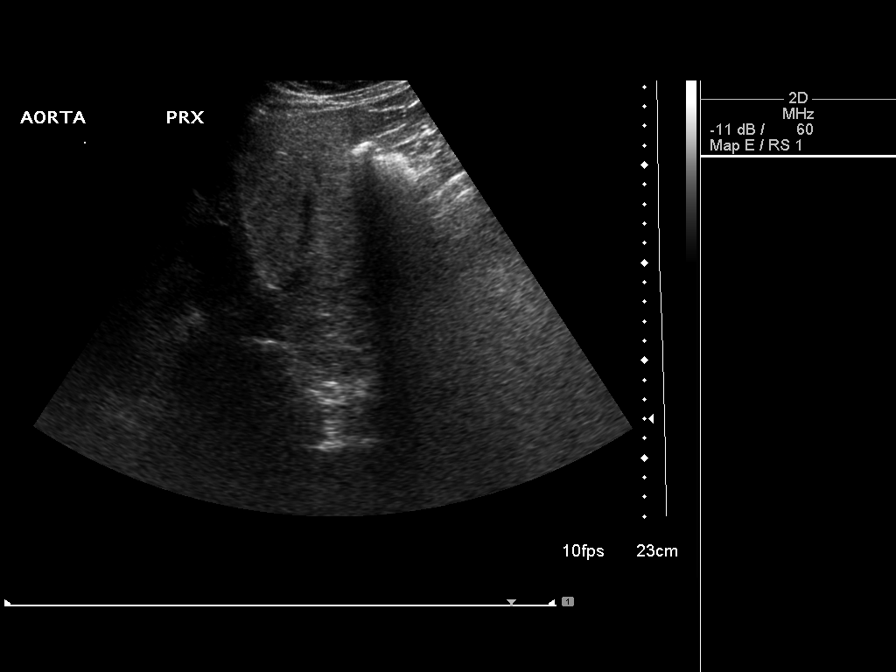
[im 4/8]
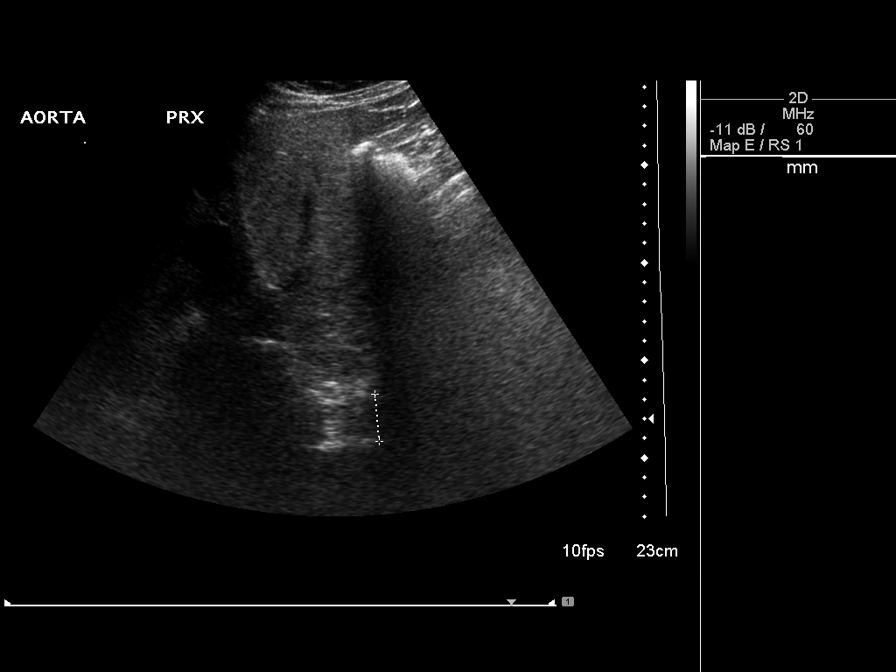
[im 5/8]
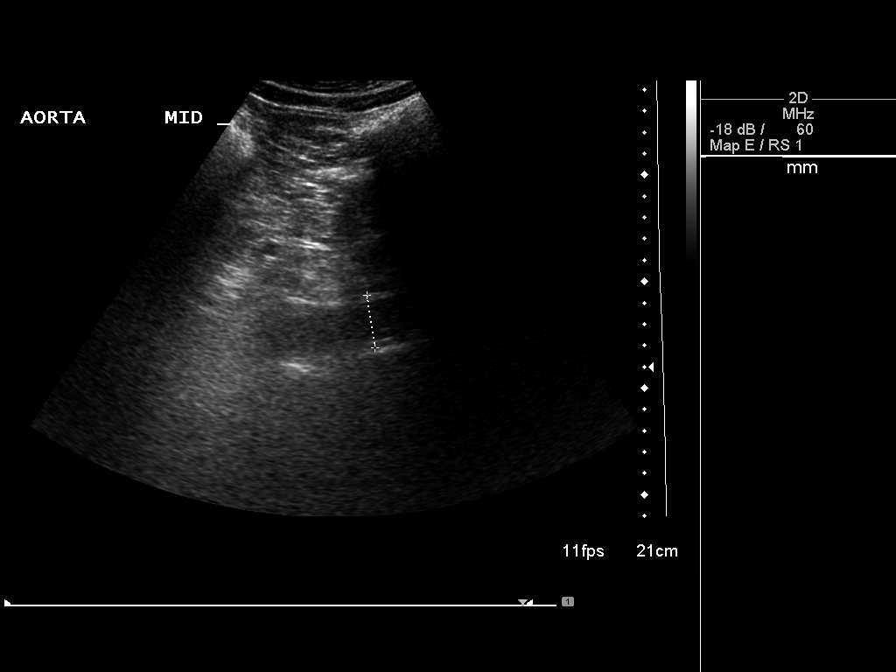
[im 6/8]
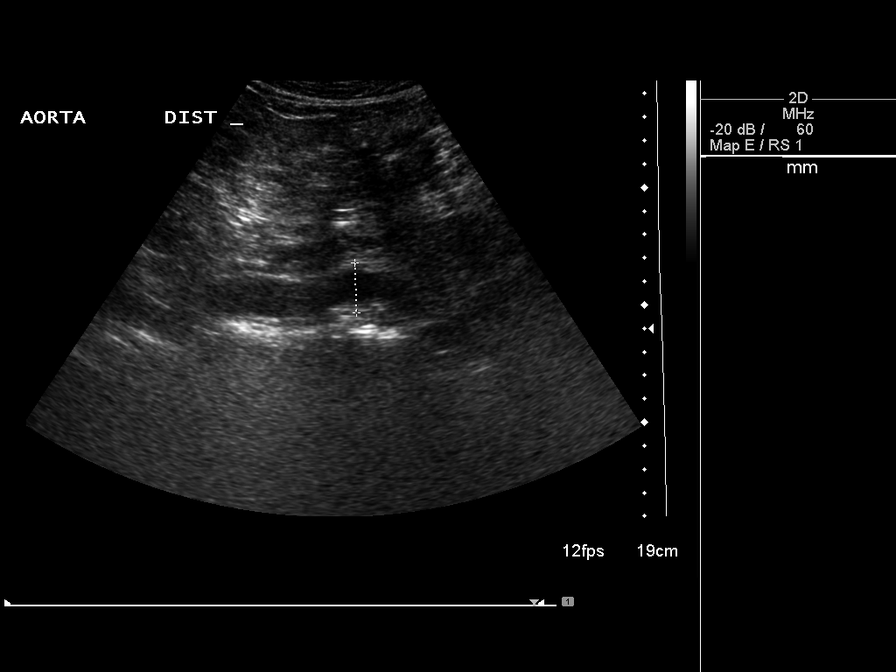
[im 7/8]
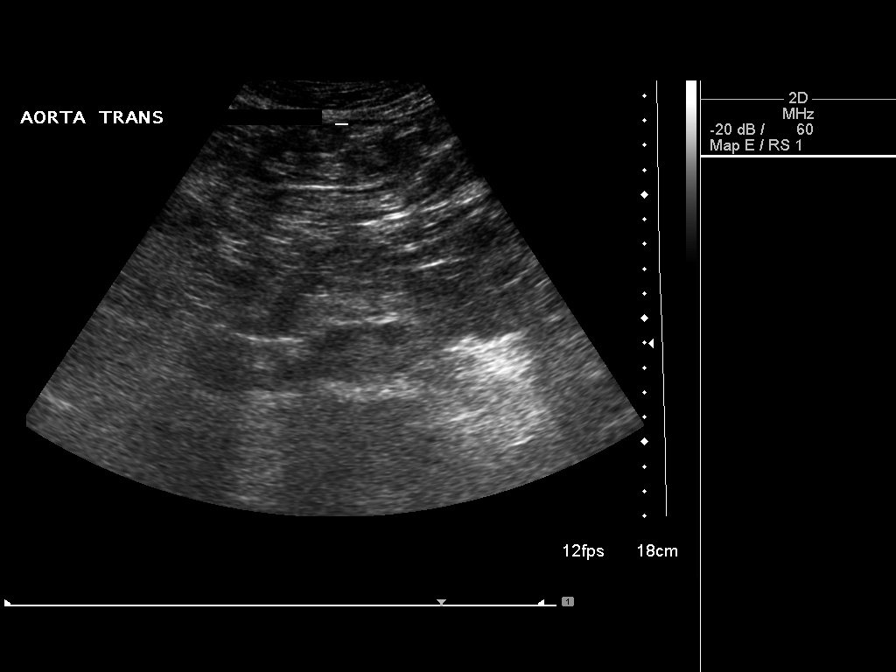
[im 8/8]
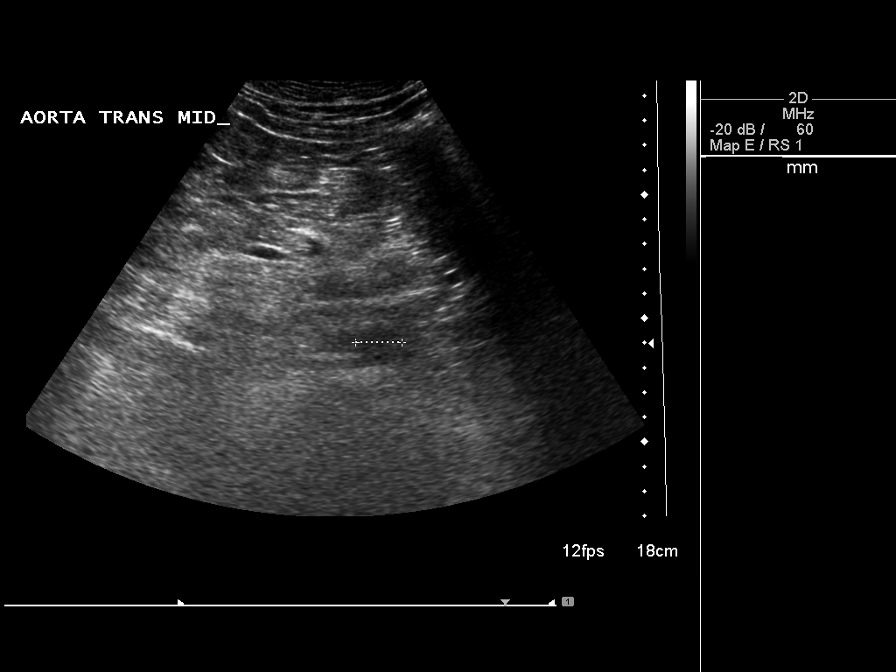

[8 of 8 positions shown; findings below may reference images not displayed]

FINDINGS: Gallbladder:

No gallstones or wall thickening visualized. No sonographic Murphy
sign noted.

Common bile duct:

Diameter: 4.5 mm in diameter.

Liver:

No focal lesion identified. There is diffuse increased liver
echogenicity suspicious for fatty infiltration.

IVC:

Not visualized due to bowel gas.

Pancreas:

Visualized portion unremarkable.

Spleen:

Size and appearance within normal limits. Measures 14.3 x 14.7 cm by
6 cm

Right Kidney:

Length: 11.8cm. Echogenicity within normal limits. No mass or
hydronephrosis visualized.

Left Kidney:

Length: 12.6 cm. Echogenicity within normal limits. No mass or
hydronephrosis visualized.

Abdominal aorta:

No aneurysm visualized.  Measures up to 2.5 cm in diameter.

Other findings:

None.
IMPRESSION: 1. No gallstones are noted within gallbladder.
2. Echogenic liver suspicious for fatty infiltration.
3. Splenomegaly is noted.  The spleen measures 14.3 x 14.7 x 6 cm.

## 2016-05-25 ENCOUNTER — Other Ambulatory Visit: Payer: Self-pay | Admitting: Family Medicine

## 2016-06-07 IMAGING — NM NM HEPATOBILIARY IMAGE, INC GB
2 series · 12 of 12 positions shown · non-contrast
Comparison: Ultrasound 01/05/2014

CLINICAL DATA: Generalized postprandial abdominal pain

EXAM:
NUCLEAR MEDICINE HEPATOBILIARY IMAGING WITH GALLBLADDER EF
TECHNIQUE: Sequential images of the abdomen were obtained [DATE] minutes
following intravenous administration of radiopharmaceutical. After
slow intravenous infusion of 2.5 micrograms Cholecystokinin,
gallbladder ejection fraction was determined.
RADIOPHARMACEUTICALS:  5.0 Millicurie Tc-99m Choletec

[he hepatobiliary · 3.43mm/px · 6 of 30 frames shown (1 of 2)]
[frame 3/30]
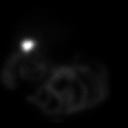
[frame 8/30]
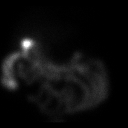
[frame 13/30]
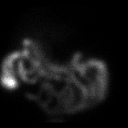
[frame 18/30]
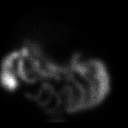
[frame 23/30]
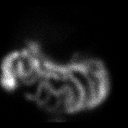
[frame 28/30]
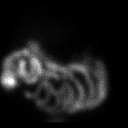

[he hepatobiliary · 3.43mm/px · 6 of 60 frames shown (2 of 2)]
[frame 6/60]
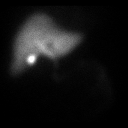
[frame 16/60]
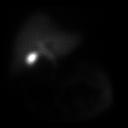
[frame 26/60]
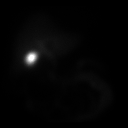
[frame 36/60]
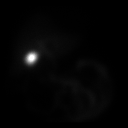
[frame 46/60]
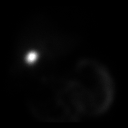
[frame 56/60]
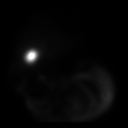

[12 of 12 positions shown; findings below may reference images not displayed]

FINDINGS: There is normal uptake of the tracer by the liver. Gallbladder and
CBD visualized at 10 min. Bowel activity noted at 15 min. Post CCK
gallbladder ejection fraction is 89%.. At 30 min, normal ejection
fraction is greater than 30%.

The patient did not experience symptoms during CCK infusion.
IMPRESSION: No cystic duct obstruction. No CBD obstruction. Post CCK gallbladder
ejection fraction is 89%. Normal gallbladder ejection fraction
should be greater than 30%

## 2016-08-24 ENCOUNTER — Other Ambulatory Visit: Payer: Self-pay | Admitting: Family Medicine

## 2016-10-02 ENCOUNTER — Ambulatory Visit (INDEPENDENT_AMBULATORY_CARE_PROVIDER_SITE_OTHER): Payer: No Typology Code available for payment source | Admitting: Family Medicine

## 2016-10-02 ENCOUNTER — Encounter: Payer: Self-pay | Admitting: Family Medicine

## 2016-10-02 DIAGNOSIS — E782 Mixed hyperlipidemia: Secondary | ICD-10-CM

## 2016-10-02 DIAGNOSIS — E119 Type 2 diabetes mellitus without complications: Secondary | ICD-10-CM

## 2016-10-02 DIAGNOSIS — I10 Essential (primary) hypertension: Secondary | ICD-10-CM

## 2016-10-02 DIAGNOSIS — E039 Hypothyroidism, unspecified: Secondary | ICD-10-CM | POA: Diagnosis not present

## 2016-10-02 LAB — CBC WITH DIFFERENTIAL/PLATELET
Basophils Absolute: 0 10*3/uL (ref 0.0–0.1)
Basophils Relative: 0.9 % (ref 0.0–3.0)
EOS ABS: 0.1 10*3/uL (ref 0.0–0.7)
Eosinophils Relative: 2.3 % (ref 0.0–5.0)
HCT: 38.3 % — ABNORMAL LOW (ref 39.0–52.0)
Hemoglobin: 12.9 g/dL — ABNORMAL LOW (ref 13.0–17.0)
Lymphocytes Relative: 25.5 % (ref 12.0–46.0)
Lymphs Abs: 1.3 10*3/uL (ref 0.7–4.0)
MCHC: 33.8 g/dL (ref 30.0–36.0)
MCV: 89.2 fl (ref 78.0–100.0)
MONO ABS: 0.3 10*3/uL (ref 0.1–1.0)
Monocytes Relative: 6.2 % (ref 3.0–12.0)
NEUTROS ABS: 3.2 10*3/uL (ref 1.4–7.7)
Neutrophils Relative %: 65.1 % (ref 43.0–77.0)
PLATELETS: 232 10*3/uL (ref 150.0–400.0)
RBC: 4.29 Mil/uL (ref 4.22–5.81)
RDW: 14.8 % (ref 11.5–15.5)
WBC: 5 10*3/uL (ref 4.0–10.5)

## 2016-10-02 LAB — HEPATIC FUNCTION PANEL
ALK PHOS: 61 U/L (ref 39–117)
ALT: 109 U/L — AB (ref 0–53)
AST: 82 U/L — AB (ref 0–37)
Albumin: 4.5 g/dL (ref 3.5–5.2)
BILIRUBIN DIRECT: 0.1 mg/dL (ref 0.0–0.3)
TOTAL PROTEIN: 6.9 g/dL (ref 6.0–8.3)
Total Bilirubin: 0.5 mg/dL (ref 0.2–1.2)

## 2016-10-02 LAB — BASIC METABOLIC PANEL WITH GFR
BUN: 19 mg/dL (ref 6–23)
CO2: 27 meq/L (ref 19–32)
Calcium: 9.7 mg/dL (ref 8.4–10.5)
Chloride: 103 meq/L (ref 96–112)
Creatinine, Ser: 1.11 mg/dL (ref 0.40–1.50)
GFR: 71.59 mL/min
Glucose, Bld: 259 mg/dL — ABNORMAL HIGH (ref 70–99)
Potassium: 4.1 meq/L (ref 3.5–5.1)
Sodium: 138 meq/L (ref 135–145)

## 2016-10-02 LAB — LDL CHOLESTEROL, DIRECT: LDL DIRECT: 86 mg/dL

## 2016-10-02 LAB — TSH: TSH: 2.01 u[IU]/mL (ref 0.35–4.50)

## 2016-10-02 LAB — PSA: PSA: 1.17 ng/mL (ref 0.10–4.00)

## 2016-10-02 LAB — LIPID PANEL
Cholesterol: 154 mg/dL (ref 0–200)
HDL: 26.3 mg/dL — ABNORMAL LOW
Total CHOL/HDL Ratio: 6
Triglycerides: 452 mg/dL — ABNORMAL HIGH (ref 0.0–149.0)

## 2016-10-02 NOTE — Progress Notes (Signed)
   Subjective:    Patient ID: Edward Olsen, male    DOB: 19-Oct-1955, 61 y.o.   MRN: [id number]  HPI Here for presurgical clearance per Dr. Silvestre Mesi at the [facility] of [facility]. Edward Olsen has been working with them and has tried numerous diet and exercise plans with poor results. They are now considering surgery, but they have not yet decided on what type to do. Edward Olsen feels fine, his BP ahs been stable, and his glucoses have been stable. He sees Dr. Chalmers Cater for diabetes management. He had a normal EKG in August 2017.    Review of Systems  Constitutional: Negative.   Eyes: Negative.   Respiratory: Negative.   Cardiovascular: Negative.   Gastrointestinal: Negative.   Endocrine: Negative.   Genitourinary: Negative.   Neurological: Negative.        Objective:   Physical Exam  Constitutional: He appears well-developed and well-nourished.  Obese   Neck: No thyromegaly present.  Cardiovascular: Normal rate, regular rhythm, normal heart sounds and intact distal pulses.   Pulmonary/Chest: Effort normal and breath sounds normal. No respiratory distress. He has no wheezes. He has no rales.  Abdominal: Soft. Bowel sounds are normal. He exhibits no distension. There is no tenderness. There is no rebound and no guarding.  Musculoskeletal: Normal range of motion. He exhibits no edema.  Lymphadenopathy:    He has no cervical adenopathy.  Neurological: He is alert.          Assessment & Plan:  He is here for clearance prior to a possible bariatric surgery. We will get labs today as part of our workup. Alysia Penna, MD

## 2016-10-02 NOTE — Patient Instructions (Signed)
WE NOW OFFER   [facility] FAST TRACK!!!  SAME DAY Appointments for ACUTE CARE  Such as: Sprains, Injuries, cuts, abrasions, rashes, muscle pain, joint pain, back pain Colds, flu, sore throats, headache, allergies, cough, fever  Ear pain, sinus and eye infections Abdominal pain, nausea, vomiting, diarrhea, upset stomach Animal/insect bites  3 Easy Ways to Schedule: Walk-In Scheduling Call in scheduling Mychart Sign-up: [email or web address]         

## 2016-11-06 ENCOUNTER — Encounter: Payer: Self-pay | Admitting: Family Medicine

## 2016-11-06 ENCOUNTER — Ambulatory Visit (INDEPENDENT_AMBULATORY_CARE_PROVIDER_SITE_OTHER): Payer: No Typology Code available for payment source | Admitting: Family Medicine

## 2016-11-06 VITALS — BP 124/70 | Temp 98.5°F | Ht 69.5 in | Wt 298.0 lb

## 2016-11-06 DIAGNOSIS — E669 Obesity, unspecified: Secondary | ICD-10-CM

## 2016-11-06 DIAGNOSIS — E782 Mixed hyperlipidemia: Secondary | ICD-10-CM

## 2016-11-06 DIAGNOSIS — I10 Essential (primary) hypertension: Secondary | ICD-10-CM | POA: Diagnosis not present

## 2016-11-06 LAB — LIPID PANEL
CHOLESTEROL: 173 mg/dL (ref <200)
HDL: 27 mg/dL — ABNORMAL LOW (ref >40)
LDL Cholesterol: 89 mg/dL (ref <100)
TRIGLYCERIDES: 284 mg/dL — AB (ref <150)
Total CHOL/HDL Ratio: 6.4 Ratio — ABNORMAL HIGH (ref <5.0)
VLDL: 57 mg/dL — AB (ref <30)

## 2016-11-06 NOTE — Patient Instructions (Signed)
WE NOW OFFER   [name] FAST TRACK!!!  SAME DAY Appointments for ACUTE CARE  Such as: Sprains, Injuries, cuts, abrasions, rashes, muscle pain, joint pain, back pain Colds, flu, sore throats, headache, allergies, cough, fever  Ear pain, sinus and eye infections Abdominal pain, nausea, vomiting, diarrhea, upset stomach Animal/insect bites  3 Easy Ways to Schedule: Walk-In Scheduling Call in scheduling Mychart Sign-up: [email or web address]         

## 2016-11-06 NOTE — Addendum Note (Signed)
Addended by: Elmer Picker on: 11/06/2016 09:14 AM   Modules accepted: Orders

## 2016-11-06 NOTE — Progress Notes (Signed)
   Subjective:    Patient ID: Edward Olsen, male    DOB: Oct 05, 1955, 61 y.o.   MRN: [id number]  HPI Here to follow up on weight loss efforts. He still meets regularly with the nutritionists and he is exercising. He has lost 3 lbs since his last visit. He is planning on having bariatric surgery per Dr. Toney Rakes at [facility]. He feels well.    Review of Systems  Constitutional: Negative.   Respiratory: Negative.   Cardiovascular: Negative.   Neurological: Negative.        Objective:   Physical Exam  Constitutional: He is oriented to person, place, and time.  Morbidly obese  Cardiovascular: Normal rate, regular rhythm, normal heart sounds and intact distal pulses.   Pulmonary/Chest: Effort normal and breath sounds normal.  Neurological: He is alert and oriented to person, place, and time.          Assessment & Plan:  Morbid obesity. He will continue with his weight loss efforts with diet and exercise. Follow up with Dr. Chalmers Cater for the diabetes.  Alysia Penna, MD

## 2016-12-04 ENCOUNTER — Ambulatory Visit: Payer: No Typology Code available for payment source | Admitting: Family Medicine

## 2016-12-14 ENCOUNTER — Encounter: Payer: Self-pay | Admitting: Family Medicine

## 2016-12-14 ENCOUNTER — Ambulatory Visit (INDEPENDENT_AMBULATORY_CARE_PROVIDER_SITE_OTHER): Payer: No Typology Code available for payment source | Admitting: Family Medicine

## 2016-12-14 VITALS — BP 110/60 | Temp 98.3°F | Ht 69.5 in | Wt 301.0 lb

## 2016-12-14 DIAGNOSIS — E669 Obesity, unspecified: Secondary | ICD-10-CM | POA: Diagnosis not present

## 2016-12-14 DIAGNOSIS — I10 Essential (primary) hypertension: Secondary | ICD-10-CM | POA: Diagnosis not present

## 2016-12-14 DIAGNOSIS — E119 Type 2 diabetes mellitus without complications: Secondary | ICD-10-CM | POA: Diagnosis not present

## 2016-12-14 NOTE — Progress Notes (Signed)
   Subjective:    Patient ID: Edward Olsen, male    DOB: [birth year], 61 y.o.   MRN: [id number]  HPI Here to follow up on weight loss efforts while he is being worked up for bariatric surgery at [facility]. He had a psychosocial evaluation there on 11-09-16, and this went well. He feels well. He is still exercising and trying to follow his nutritionist's diet.   Review of Systems  Constitutional: Negative.   Respiratory: Negative.   Cardiovascular: Negative.   Neurological: Negative.        Objective:   Physical Exam  Constitutional: He is oriented to person, place, and time.  Obese   Cardiovascular: Normal rate, regular rhythm, normal heart sounds and intact distal pulses.   Pulmonary/Chest: Effort normal and breath sounds normal. No respiratory distress. He has no wheezes. He has no rales.  Neurological: He is alert and oriented to person, place, and time.          Assessment & Plan:  He is working through the preoperative program for bariatric surgery at [facility]. We will meet again in one month.  Alysia Penna, MD

## 2016-12-14 NOTE — Patient Instructions (Signed)
WE NOW OFFER    [facility] FAST TRACK!!!  SAME DAY Appointments for ACUTE CARE  Such as: Sprains, Injuries, cuts, abrasions, rashes, muscle pain, joint pain, back pain Colds, flu, sore throats, headache, allergies, cough, fever  Ear pain, sinus and eye infections Abdominal pain, nausea, vomiting, diarrhea, upset stomach Animal/insect bites  3 Easy Ways to Schedule: Walk-In Scheduling Call in scheduling Mychart Sign-up: [email or web address]         

## 2016-12-18 ENCOUNTER — Other Ambulatory Visit: Payer: Self-pay | Admitting: Family Medicine

## 2016-12-28 ENCOUNTER — Other Ambulatory Visit: Payer: Self-pay | Admitting: Family Medicine

## 2016-12-28 NOTE — Telephone Encounter (Signed)
Can we refill this? 

## 2016-12-29 ENCOUNTER — Other Ambulatory Visit: Payer: Self-pay | Admitting: Family Medicine

## 2016-12-29 NOTE — Telephone Encounter (Signed)
Can we refill this? 

## 2017-01-15 ENCOUNTER — Ambulatory Visit (INDEPENDENT_AMBULATORY_CARE_PROVIDER_SITE_OTHER): Payer: No Typology Code available for payment source | Admitting: Family Medicine

## 2017-01-15 ENCOUNTER — Encounter: Payer: Self-pay | Admitting: Family Medicine

## 2017-01-15 VITALS — BP 124/70 | Ht 69.5 in | Wt 300.0 lb

## 2017-01-15 DIAGNOSIS — I10 Essential (primary) hypertension: Secondary | ICD-10-CM

## 2017-01-15 DIAGNOSIS — E669 Obesity, unspecified: Secondary | ICD-10-CM

## 2017-01-15 DIAGNOSIS — L570 Actinic keratosis: Secondary | ICD-10-CM

## 2017-01-15 NOTE — Progress Notes (Signed)
   Subjective:    Patient ID: ISSACHAR BROADY, male    DOB: 1956/03/30, 61 y.o.   MRN: [id number]  HPI Here to follow up on preparations for bariatric surgery. He feels fine. He has met with the nutritionist and a the psychologist at [facility]. He is watching his diet and walking. He does ask me to check a lesion on the left forearm. This has been present for several years but it became irritated this past week.    Review of Systems  Constitutional: Negative.   Respiratory: Negative.   Cardiovascular: Negative.   Neurological: Negative.        Objective:   Physical Exam  Constitutional: He is oriented to person, place, and time.  Morbidly obese   Cardiovascular: Normal rate, regular rhythm, normal heart sounds and intact distal pulses.   Pulmonary/Chest: Effort normal and breath sounds normal. No respiratory distress. He has no wheezes. He has no rales.  Neurological: He is alert and oriented to person, place, and time.  Skin:  There is a 3 mm papular scaly lesion on the left dorsal forearm          Assessment & Plan:  Morbid obesity. He has lost one lb in the past month. We will continue to monitor him closely. Recheck in one month. He has an actinic keratosis on the left arm and I asked him to see his Dermatologist soon to remove this.  Alysia Penna, MD

## 2017-01-15 NOTE — Patient Instructions (Signed)
WE NOW OFFER   [facility] [place] [facility] FAST TRACK!!!  SAME DAY Appointments for ACUTE CARE  Such as: Sprains, Injuries, cuts, abrasions, rashes, muscle pain, joint pain, back pain Colds, flu, sore throats, headache, allergies, cough, fever  Ear pain, sinus and eye infections Abdominal pain, nausea, vomiting, diarrhea, upset stomach Animal/insect bites  3 Easy Ways to Schedule: Walk-In Scheduling Call in scheduling Mychart Sign-up: [email or web address]         

## 2017-01-19 ENCOUNTER — Other Ambulatory Visit: Payer: Self-pay | Admitting: Family Medicine

## 2017-02-12 ENCOUNTER — Ambulatory Visit (INDEPENDENT_AMBULATORY_CARE_PROVIDER_SITE_OTHER): Payer: No Typology Code available for payment source | Admitting: Family Medicine

## 2017-02-12 ENCOUNTER — Encounter: Payer: Self-pay | Admitting: Family Medicine

## 2017-02-12 VITALS — BP 126/66 | Temp 98.3°F | Ht 69.5 in | Wt 301.0 lb

## 2017-02-12 DIAGNOSIS — Z23 Encounter for immunization: Secondary | ICD-10-CM | POA: Diagnosis not present

## 2017-02-12 DIAGNOSIS — I10 Essential (primary) hypertension: Secondary | ICD-10-CM | POA: Diagnosis not present

## 2017-02-12 DIAGNOSIS — E669 Obesity, unspecified: Secondary | ICD-10-CM | POA: Diagnosis not present

## 2017-02-12 DIAGNOSIS — E119 Type 2 diabetes mellitus without complications: Secondary | ICD-10-CM | POA: Diagnosis not present

## 2017-02-12 NOTE — Patient Instructions (Signed)
WE NOW OFFER   [facility] [facility] FAST TRACK!!!  SAME DAY Appointments for ACUTE CARE  Such as: Sprains, Injuries, cuts, abrasions, rashes, muscle pain, joint pain, back pain Colds, flu, sore throats, headache, allergies, cough, fever  Ear pain, sinus and eye infections Abdominal pain, nausea, vomiting, diarrhea, upset stomach Animal/insect bites  3 Easy Ways to Schedule: Walk-In Scheduling Call in scheduling Mychart Sign-up: [email or web address]         

## 2017-02-12 NOTE — Addendum Note (Signed)
Addended by: Aggie Hacker A on: 02/12/2017 09:25 AM   Modules accepted: Orders

## 2017-02-12 NOTE — Progress Notes (Signed)
   Subjective:    Patient ID: Edward Olsen, male    DOB: [birth year], 61 y.o.   MRN: [id number]  HPI Here to review his preparations for bariatric surgery. He feels fine. He is meeting with a psychologist and a nutritionist through the [facility] program. He is watching his diet and exercising. His glucoses and BP are stable. His weight remains unchanged.    Review of Systems  Constitutional: Negative.   Respiratory: Negative.   Cardiovascular: Negative.        Objective:   Physical Exam  Constitutional: He is oriented to person, place, and time.  Morbidly obese   Cardiovascular: Normal rate, regular rhythm, normal heart sounds and intact distal pulses.   Pulmonary/Chest: Effort normal and breath sounds normal. No respiratory distress. He has no wheezes. He has no rales.  Neurological: He is alert and oriented to person, place, and time.          Assessment & Plan:  Obesity. He is continuing on his journey toward bariatric surgery. Recheck in one month.  Alysia Penna, MD

## 2017-03-14 ENCOUNTER — Other Ambulatory Visit: Payer: Self-pay | Admitting: Family Medicine

## 2017-03-15 ENCOUNTER — Encounter: Payer: Self-pay | Admitting: Family Medicine

## 2017-03-15 ENCOUNTER — Ambulatory Visit: Payer: No Typology Code available for payment source | Admitting: Family Medicine

## 2017-03-15 VITALS — BP 110/60 | Temp 98.3°F | Ht 69.5 in | Wt 300.0 lb

## 2017-03-15 DIAGNOSIS — E119 Type 2 diabetes mellitus without complications: Secondary | ICD-10-CM | POA: Diagnosis not present

## 2017-03-15 DIAGNOSIS — I10 Essential (primary) hypertension: Secondary | ICD-10-CM

## 2017-03-15 DIAGNOSIS — E669 Obesity, unspecified: Secondary | ICD-10-CM

## 2017-03-15 MED ORDER — PRAMIPEXOLE DIHYDROCHLORIDE 1 MG PO TABS: ORAL_TABLET | ORAL | 1 refills | Status: DC

## 2017-03-15 MED ORDER — IRBESARTAN 75 MG PO TABS: 75.0000 mg | ORAL_TABLET | Freq: Every day | ORAL | 3 refills | Status: DC

## 2017-03-15 NOTE — Patient Instructions (Signed)
WE NOW OFFER   [facility] FAST TRACK!!!  SAME DAY Appointments for ACUTE CARE  Such as: Sprains, Injuries, cuts, abrasions, rashes, muscle pain, joint pain, back pain Colds, flu, sore throats, headache, allergies, cough, fever  Ear pain, sinus and eye infections Abdominal pain, nausea, vomiting, diarrhea, upset stomach Animal/insect bites  3 Easy Ways to Schedule: Walk-In Scheduling Call in scheduling Mychart Sign-up: [email or web address]         

## 2017-03-15 NOTE — Progress Notes (Signed)
   Subjective:    Patient ID: Edward Olsen, male    DOB: 10/01/1955, 61 y.o.   MRN: [id number]  HPI Here for another follow up visit to monitor his weight and BP. He is on track for bariatric surgery and he continues with the program of diet and exercise. His weight has stabilized around the 300 lb mark. He feels well.    Review of Systems  Constitutional: Negative.   Respiratory: Negative.   Cardiovascular: Negative.   Neurological: Negative.   Psychiatric/Behavioral: Negative.        Objective:   Physical Exam  Constitutional: He is oriented to person, place, and time. He appears well-developed and well-nourished.  Cardiovascular: Normal rate, regular rhythm, normal heart sounds and intact distal pulses.  Pulmonary/Chest: Effort normal and breath sounds normal. No respiratory distress. He has no wheezes. He has no rales.  Neurological: He is alert and oriented to person, place, and time.  Psychiatric: He has a normal mood and affect. His behavior is normal. Thought content normal.          Assessment & Plan:  He is progressing through the presurgical pathway for bariatric surgery. He is doing well from a general health perspective.  Alysia Penna, MD

## 2017-03-19 ENCOUNTER — Ambulatory Visit: Payer: No Typology Code available for payment source | Admitting: Family Medicine

## 2017-03-22 ENCOUNTER — Telehealth: Payer: Self-pay

## 2017-03-22 NOTE — Telephone Encounter (Signed)
[facility] is requesting new refills for the following Pramipexole and fenofibrate. Called pt left a VM to call back. Called [facility] and they stated that they can Not disclose any information with me until th ept is aware that I am call on there behalf.

## 2017-03-23 ENCOUNTER — Other Ambulatory Visit: Payer: Self-pay

## 2017-03-23 MED ORDER — FENOFIBRATE 145 MG PO TABS: 145.0000 mg | ORAL_TABLET | Freq: Every day | ORAL | 2 refills | Status: DC

## 2017-03-23 MED ORDER — PRAMIPEXOLE DIHYDROCHLORIDE 1 MG PO TABS: ORAL_TABLET | ORAL | 2 refills | Status: DC

## 2017-03-23 NOTE — Telephone Encounter (Signed)
Spoke to pt he stated that yes he does need new Rx's to be sent to [place]. Last Rx for pr[facility]ipe[facility]ole was [facility] needs to [facility] to [place]. Tricor or fenofibrate was last refilled at [place] but pt stated that the Rx was closed out due to him being out of town and Rx was transferred to another pharmacy [place]. Rx's were sent for a 90 day supply to [facility]. Pt advised.

## 2017-03-23 NOTE — Telephone Encounter (Signed)
Patient calling back to speak to Edward Olsen about his prescriptions.

## 2017-03-23 NOTE — Telephone Encounter (Signed)
Spoke to pt he stated that yes he does need new Rx's to be sent to [place]. Last Rx for pramipexole was [facility] a local [facility] needs to go to [place]. Tricor or fenofibrate was last refilled at [place] but pt stated that the Rx was closed out due to him being out of town and Rx was transferred to another pharmacy [place]. Rx's were sent for a 90 day supply to [facility]. Pt advised.

## 2017-04-16 ENCOUNTER — Ambulatory Visit: Payer: No Typology Code available for payment source | Admitting: Family Medicine

## 2017-04-16 ENCOUNTER — Encounter: Payer: Self-pay | Admitting: Family Medicine

## 2017-04-16 VITALS — BP 124/60 | HR 89 | Temp 97.6°F | Wt 303.6 lb

## 2017-04-16 DIAGNOSIS — E669 Obesity, unspecified: Secondary | ICD-10-CM | POA: Diagnosis not present

## 2017-04-16 DIAGNOSIS — E119 Type 2 diabetes mellitus without complications: Secondary | ICD-10-CM | POA: Diagnosis not present

## 2017-04-16 DIAGNOSIS — I10 Essential (primary) hypertension: Secondary | ICD-10-CM

## 2017-04-16 NOTE — Progress Notes (Signed)
   Subjective:    Patient ID: Edward Olsen, male    DOB: 08-Nov-1955, 61 y.o.   MRN: [id number]  HPI Here to follow up on preparations for gastric bypass surgery. He has been working with Dr. Silvestre Mesi at [facility]. He has been following a diet and exercise plan. He feels great. His BP is stable. He saw Dr. Chalmers Cater last month and his diabetes was stable.    Review of Systems  Constitutional: Negative.   Respiratory: Negative.   Cardiovascular: Negative.   Neurological: Negative.        Objective:   Physical Exam  Constitutional: He is oriented to person, place, and time. He appears well-developed and well-nourished.  Cardiovascular: Normal rate, regular rhythm, normal heart sounds and intact distal pulses.  Pulmonary/Chest: Effort normal and breath sounds normal. No respiratory distress. He has no wheezes. He has no rales.  Neurological: He is alert and oriented to person, place, and time.          Assessment & Plan:  Morbid obesity. He will follow up with Dr. Toney Rakes.  Alysia Penna, MD
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
## 2017-04-23 ENCOUNTER — Ambulatory Visit (INDEPENDENT_AMBULATORY_CARE_PROVIDER_SITE_OTHER): Payer: No Typology Code available for payment source | Admitting: Psychiatry

## 2017-04-23 ENCOUNTER — Encounter (HOSPITAL_COMMUNITY): Payer: Self-pay | Admitting: Psychiatry

## 2017-04-23 VITALS — BP 145/87 | HR 93 | Ht 69.5 in | Wt 301.0 lb

## 2017-04-23 DIAGNOSIS — Z811 Family history of alcohol abuse and dependence: Secondary | ICD-10-CM

## 2017-04-23 DIAGNOSIS — R450 Nervousness: Secondary | ICD-10-CM | POA: Diagnosis not present

## 2017-04-23 DIAGNOSIS — G4730 Sleep apnea, unspecified: Secondary | ICD-10-CM | POA: Diagnosis not present

## 2017-04-23 DIAGNOSIS — F419 Anxiety disorder, unspecified: Secondary | ICD-10-CM | POA: Diagnosis not present

## 2017-04-23 DIAGNOSIS — Z87891 Personal history of nicotine dependence: Secondary | ICD-10-CM | POA: Diagnosis not present

## 2017-04-23 DIAGNOSIS — F4321 Adjustment disorder with depressed mood: Secondary | ICD-10-CM

## 2017-04-23 DIAGNOSIS — G4700 Insomnia, unspecified: Secondary | ICD-10-CM

## 2017-04-23 NOTE — Patient Instructions (Addendum)
1. Will not start antidepressant at this time 2. If any worsening in mood symptoms, contact the office for follow up appointment (may consider referral to [facility] office if there is any availability)

## 2017-05-27 ENCOUNTER — Ambulatory Visit (INDEPENDENT_AMBULATORY_CARE_PROVIDER_SITE_OTHER): Payer: No Typology Code available for payment source | Admitting: Licensed Clinical Social Worker

## 2017-05-27 ENCOUNTER — Encounter (HOSPITAL_COMMUNITY): Payer: Self-pay | Admitting: Licensed Clinical Social Worker

## 2017-05-27 DIAGNOSIS — F4321 Adjustment disorder with depressed mood: Secondary | ICD-10-CM
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
## 2017-06-08 ENCOUNTER — Telehealth (HOSPITAL_COMMUNITY): Payer: Self-pay | Admitting: Psychiatry

## 2017-06-08 ENCOUNTER — Telehealth (HOSPITAL_COMMUNITY): Payer: Self-pay | Admitting: Licensed Clinical Social Worker

## 2017-06-08 NOTE — Telephone Encounter (Signed)
Finally able to speak with patient the psychologist is the one that accepts his insurance & he will see while   preparing for gastric bypass surgery. Patient requested to mail a ROI form along with our fax #.  He will fill out & fax back

## 2017-06-08 NOTE — Telephone Encounter (Signed)
Psycholigist from [facility] who is overseeing pt's upcoming bariatic surgery called aboutt pt's assessment, dx and therapy. Discussed changing theapy to bi weekly. She will have pt call me to make additional appts.    By Jenkins Rouge, LCAS

## 2017-06-08 NOTE — Telephone Encounter (Signed)
Because it has been more than a month since the last time I saw him, it is the best that he comes for follow up to discuss about medication. Having said that, if he has an appointment at [facility] psychiatrist, it may make more sense that he waits for that appointment (unless the appointment is too far). I would be happy to see him though, if it works better for him. Also please make sure that the fax/consent form is sent to [place] so that we can contact with his psychologist.

## 2017-06-08 NOTE — Telephone Encounter (Signed)
Spoke with patient & informed per Dr Modesta Messing: Because it has been more than a month since the last time I saw him, it is the best that he comes for follow up to discuss about medication. Having said that, if he has an appointment at [facility] psychiatrist, it may make more sense that he waits for that appointment (unless the appointment is too far). I would be happy to see him though, if it works better for him. Also please make sure that the fax/consent form is sent to [place] so that we can contact with his psychologist

## 2017-06-08 NOTE — Telephone Encounter (Signed)
Dr Modesta Messing Patient called stating that the 1 visit he had you guy's discussed trying med's & he declined. Now he is requesting to try the medication even though he's about to see [facility]

## 2017-06-08 NOTE — Telephone Encounter (Signed)
Noted, thanks. Please update me when we have a fax.

## 2017-06-08 NOTE — Telephone Encounter (Signed)
Could you make sure that we have consent form from patient before discussing about the patient? (I sent this message to you before, fyi)

## 2017-06-17 ENCOUNTER — Ambulatory Visit (INDEPENDENT_AMBULATORY_CARE_PROVIDER_SITE_OTHER): Payer: No Typology Code available for payment source | Admitting: Licensed Clinical Social Worker

## 2017-06-17 ENCOUNTER — Encounter (HOSPITAL_COMMUNITY): Payer: Self-pay | Admitting: Licensed Clinical Social Worker

## 2017-06-17 DIAGNOSIS — F4321 Adjustment disorder with depressed mood: Secondary | ICD-10-CM

## 2017-06-17 NOTE — Progress Notes (Signed)
   THERAPIST PROGRESS NOTE  Session Time: 9:10-10am  Participation Level: Active  Behavioral Response: CasualAlertEuthymic  Type of Therapy: Individual Therapy  Treatment Goals addressed: Depression  Interventions: Motivational Interviewing  Summary: Edward Olsen is a 62 y.o. male who presents for his initial individual counseling session. Pt discussed his psychiatric symptoms and current life events. Spent a considerable amount of time building a trusting therapeutic relationship. Pt is referred by his psychologist at [place] prior to his bariatric surgery. Pt was also referred to psychiatrist, Dr. Ree Edman in [place]. He refused medication. Pt does not think he has depression and is only agreeing to come to therapy as part of the bariatric surgery plan. Discussed with pt his diagnosis and depressive symptoms. Discussed basic Motivational Interviewing concepts. Pt has agreed to come to therapy bi weekly.   Suicidal/Homicidal: Nowithout intent/plan  Therapist Response: Assessed pt's current functioning and reviewed progress. Assisted pt building a trusting therapeutic relationship. Assisted pt processing MI concepts, depressive symptoms and diagnosis. Assisted pt processing for the management of his stressors.   Plan: Return again in 2 weeks.  Diagnosis: Axis I: Adjustment disorder with depressed mood    Edward Olsen S, LCAS 06/17/2017

## 2017-06-23 ENCOUNTER — Telehealth (HOSPITAL_COMMUNITY): Payer: Self-pay | Admitting: Psychiatry

## 2017-06-23 NOTE — Telephone Encounter (Signed)
Last we spoke he was very frustrated & said he'll just wait.

## 2017-06-23 NOTE — Telephone Encounter (Signed)
Have we received consent form from the patient to discuss with his psychologist? If he is not agreeable to send it to [place], please contact the psychologist below and update the situation.   Psychologist Juliane Poot from [facility]  # [phone].

## 2017-06-23 NOTE — Telephone Encounter (Signed)
Please update the situation to Dr. Owens Shark, psychologist that I will not be able to discuss the case with her as the patient has not signed the form for release of information.

## 2017-06-28 ENCOUNTER — Ambulatory Visit (INDEPENDENT_AMBULATORY_CARE_PROVIDER_SITE_OTHER): Payer: No Typology Code available for payment source | Admitting: Licensed Clinical Social Worker

## 2017-06-28 ENCOUNTER — Encounter (HOSPITAL_COMMUNITY): Payer: Self-pay | Admitting: Licensed Clinical Social Worker

## 2017-06-28 DIAGNOSIS — F4321 Adjustment disorder with depressed mood: Secondary | ICD-10-CM

## 2017-06-28 NOTE — Progress Notes (Signed)
   THERAPIST PROGRESS NOTE  Session Time: 1:10-2pm  Participation Level: Active  Behavioral Response: CasualAlert/Defensive  Type of Therapy: Individual Therapy  Treatment Goals addressed: Depression  Interventions: Motivational Interviewing  Summary: Edward Olsen is a 62 y.o. male who presents for his initial individual counseling session. Pt discussed his psychiatric symptoms and current life events. Pt presents with excuses about why he feels he does not need therapy. "I feel fine." Pt is referred for therapy from the psychologist managing his upcoming bariatric surgery. Pt is defensive in session but polite. Used MI to support and develop discrepancy and deal with pt's resistance.        Suicidal/Homicidal: Nowithout intent/plan  Therapist Response: Assessed pt's current functioning and reviewed progress. Assisted pt using motivational interviewing to support and develop discrepancy and deal with pt's resistance. Assisted pt processing for the management of his stressors.   Plan: Return again in 2 weeks.  Diagnosis: Axis I: Adjustment disorder with depressed mood    [name] S, LCAS 06/28/2017

## 2017-06-29 ENCOUNTER — Other Ambulatory Visit: Payer: Self-pay | Admitting: Family Medicine

## 2017-07-15 ENCOUNTER — Ambulatory Visit (HOSPITAL_COMMUNITY): Payer: Self-pay | Admitting: Licensed Clinical Social Worker

## 2017-07-26 ENCOUNTER — Encounter: Payer: Self-pay | Admitting: Gastroenterology

## 2017-07-29 ENCOUNTER — Ambulatory Visit (INDEPENDENT_AMBULATORY_CARE_PROVIDER_SITE_OTHER): Payer: No Typology Code available for payment source | Admitting: Licensed Clinical Social Worker

## 2017-07-29 ENCOUNTER — Encounter (HOSPITAL_COMMUNITY): Payer: Self-pay | Admitting: Licensed Clinical Social Worker

## 2017-07-29 DIAGNOSIS — F4321 Adjustment disorder with depressed mood: Secondary | ICD-10-CM | POA: Diagnosis not present

## 2017-07-29 NOTE — Progress Notes (Signed)
   THERAPIST PROGRESS NOTE  Session Time: 10:10-11am  Participation Level: Active  Behavioral Response: CasualAlert/Defensive  Type of Therapy: Individual Therapy  Treatment Goals addressed: Depression  Interventions: Motivational Interviewing  Summary: Edward Olsen is a 62 y.o. male who presents for his initial individual counseling session. Pt discussed his psychiatric symptoms and current life events. Pt brought his wife in for this therapy session to discuss his upcoming bariatric surgery.Asked open ended questions and used empathic reflection. Pt still contends he doesn't need therapy and only comes because [facility] requires it before the surgery. Talked with both about pt's relationship with food and behavior change. Pt thinks all his woes will go away after surgery. Reminded pt that nothing will change if he doesn't change behaviors. Continued to use MI to support and develop discrepancy and deal with pt's resistance.        Suicidal/Homicidal: Nowithout intent/plan  Therapist Response: Assessed pt's current functioning and reviewed progress. Assisted pt using motivational interviewing to support and develop discrepancy and deal with pt's resistance, discussed pt's relationship with food, discussed with both pt and wife future goals for after the surgery.   Plan: Return again in 2 weeks.  Diagnosis: Axis I: Adjustment disorder with depressed mood    Veryl Winemiller S, LCAS 07/29/2017

## 2017-08-26 ENCOUNTER — Ambulatory Visit (HOSPITAL_COMMUNITY): Payer: Self-pay | Admitting: Licensed Clinical Social Worker

## 2017-09-09 ENCOUNTER — Ambulatory Visit (HOSPITAL_COMMUNITY): Payer: Self-pay | Admitting: Licensed Clinical Social Worker

## 2017-09-19 ENCOUNTER — Other Ambulatory Visit: Payer: Self-pay | Admitting: Family Medicine

## 2017-09-23 ENCOUNTER — Ambulatory Visit (HOSPITAL_COMMUNITY): Payer: Self-pay | Admitting: Licensed Clinical Social Worker

## 2017-10-04 ENCOUNTER — Other Ambulatory Visit: Payer: Self-pay | Admitting: Family Medicine

## 2017-10-04 NOTE — Telephone Encounter (Signed)
Last OV 04/16/2017   Last refilled 12/28/2016 disp 90 with 3 refills   Sent to PCP for approval

## 2017-11-12 ENCOUNTER — Other Ambulatory Visit: Payer: Self-pay | Admitting: Family Medicine

## 2017-12-16 ENCOUNTER — Other Ambulatory Visit: Payer: Self-pay | Admitting: Family Medicine

## 2017-12-23 ENCOUNTER — Other Ambulatory Visit: Payer: Self-pay | Admitting: Family Medicine

## 2017-12-24 ENCOUNTER — Other Ambulatory Visit: Payer: Self-pay | Admitting: Family Medicine

## 2018-01-12 ENCOUNTER — Encounter: Payer: Self-pay | Admitting: Gastroenterology

## 2018-02-11 ENCOUNTER — Encounter: Payer: Self-pay | Admitting: Gastroenterology

## 2018-02-11 ENCOUNTER — Encounter (INDEPENDENT_AMBULATORY_CARE_PROVIDER_SITE_OTHER): Payer: Self-pay

## 2018-02-11 ENCOUNTER — Other Ambulatory Visit: Payer: Self-pay

## 2018-02-11 ENCOUNTER — Ambulatory Visit (AMBULATORY_SURGERY_CENTER): Payer: Self-pay | Admitting: *Deleted

## 2018-02-11 VITALS — Ht 69.0 in | Wt 296.2 lb

## 2018-02-11 DIAGNOSIS — Z86010 Personal history of colonic polyps: Secondary | ICD-10-CM

## 2018-02-11 MED ORDER — PEG 3350-KCL-NA BICARB-NACL 420 G PO SOLR: 4000.0000 mL | Freq: Once | ORAL | 0 refills | Status: AC

## 2018-02-11 NOTE — Progress Notes (Signed)
Patient denies any allergies to egg or soy products. Patient denies complications with anesthesia/sedation.  Patient denies oxygen use at home and denies diet medications.  Patient uses CPAP nightly. Patient denies information on colonoscopy.

## 2018-02-16 ENCOUNTER — Other Ambulatory Visit: Payer: Self-pay | Admitting: Family Medicine

## 2018-02-16 ENCOUNTER — Ambulatory Visit: Payer: No Typology Code available for payment source | Admitting: Family Medicine

## 2018-02-16 ENCOUNTER — Encounter: Payer: Self-pay | Admitting: Family Medicine

## 2018-02-16 VITALS — BP 130/62 | HR 81 | Temp 98.1°F | Wt 298.4 lb

## 2018-02-16 DIAGNOSIS — M109 Gout, unspecified: Secondary | ICD-10-CM | POA: Diagnosis not present

## 2018-02-16 MED ORDER — NAPROXEN 500 MG PO TABS: 500.0000 mg | ORAL_TABLET | Freq: Two times a day (BID) | ORAL | 11 refills | Status: DC

## 2018-02-16 MED ORDER — COLCHICINE 0.6 MG PO TABS: 0.6000 mg | ORAL_TABLET | Freq: Four times a day (QID) | ORAL | 2 refills | Status: DC | PRN

## 2018-02-16 NOTE — Progress Notes (Signed)
   Subjective:    Patient ID: ZELMA MAZARIEGO, male    DOB: Dec 17, 1955, 62 y.o.   MRN: [id number]  HPI Here for 2 weeks of pain and swelling in the left ankle. No recent trauma. Ibuprofen helps a little. He has a hx of gout, usually in the great toes.    Review of Systems  Constitutional: Negative.   Respiratory: Negative.   Cardiovascular: Negative.   Musculoskeletal: Positive for arthralgias and joint swelling.  Neurological: Negative.        Objective:   Physical Exam  Constitutional: He is oriented to person, place, and time. He appears well-developed and well-nourished.  Cardiovascular: Normal rate, regular rhythm, normal heart sounds and intact distal pulses.  Pulmonary/Chest: Effort normal and breath sounds normal.  Musculoskeletal:  The left ankle is swollen and tender both medially and laterally. No erythema or warmth. Full ROM   Neurological: He is alert and oriented to person, place, and time.          Assessment & Plan:  Gout. Treat with Naproxen and Colchicine prn. Alysia Penna, MD

## 2018-02-25 ENCOUNTER — Encounter: Payer: Self-pay | Admitting: Gastroenterology

## 2018-03-10 ENCOUNTER — Other Ambulatory Visit: Payer: Self-pay | Admitting: Family Medicine

## 2018-03-31 ENCOUNTER — Other Ambulatory Visit: Payer: Self-pay | Admitting: Family Medicine

## 2018-04-13 ENCOUNTER — Other Ambulatory Visit: Payer: Self-pay | Admitting: Family Medicine

## 2018-04-13 MED ORDER — ROSUVASTATIN CALCIUM 10 MG PO TABS: 10.0000 mg | ORAL_TABLET | Freq: Every day | ORAL | 0 refills | Status: DC

## 2018-04-13 NOTE — Telephone Encounter (Signed)
Sent in for 30 days.  Pt is past due for cpx and lab work.

## 2018-04-19 ENCOUNTER — Ambulatory Visit (INDEPENDENT_AMBULATORY_CARE_PROVIDER_SITE_OTHER): Payer: No Typology Code available for payment source | Admitting: Family Medicine

## 2018-04-19 ENCOUNTER — Encounter: Payer: Self-pay | Admitting: Family Medicine

## 2018-04-19 VITALS — BP 136/78 | HR 76 | Temp 98.2°F | Ht 70.0 in | Wt 296.5 lb

## 2018-04-19 DIAGNOSIS — Z0000 Encounter for general adult medical examination without abnormal findings: Secondary | ICD-10-CM

## 2018-04-19 LAB — HEPATIC FUNCTION PANEL
ALBUMIN: 4.7 g/dL (ref 3.5–5.2)
ALK PHOS: 56 U/L (ref 39–117)
ALT: 91 U/L — AB (ref 0–53)
AST: 53 U/L — ABNORMAL HIGH (ref 0–37)
Bilirubin, Direct: 0.1 mg/dL (ref 0.0–0.3)
Total Bilirubin: 0.5 mg/dL (ref 0.2–1.2)
Total Protein: 6.9 g/dL (ref 6.0–8.3)

## 2018-04-19 LAB — BASIC METABOLIC PANEL
BUN: 21 mg/dL (ref 6–23)
CO2: 27 meq/L (ref 19–32)
Calcium: 10 mg/dL (ref 8.4–10.5)
Chloride: 104 mEq/L (ref 96–112)
Creatinine, Ser: 1.14 mg/dL (ref 0.40–1.50)
GFR: 69.07 mL/min (ref >60.00)
GLUCOSE: 136 mg/dL — AB (ref 70–99)
POTASSIUM: 4.4 meq/L (ref 3.5–5.1)
Sodium: 139 mEq/L (ref 135–145)

## 2018-04-19 LAB — CBC WITH DIFFERENTIAL/PLATELET
Basophils Absolute: 0 10*3/uL (ref 0.0–0.1)
Basophils Relative: 0.7 % (ref 0.0–3.0)
EOS PCT: 3.7 % (ref 0.0–5.0)
Eosinophils Absolute: 0.2 10*3/uL (ref 0.0–0.7)
HCT: 42.2 % (ref 39.0–52.0)
Hemoglobin: 14.1 g/dL (ref 13.0–17.0)
LYMPHS PCT: 34.5 % (ref 12.0–46.0)
Lymphs Abs: 1.7 10*3/uL (ref 0.7–4.0)
MCHC: 33.5 g/dL (ref 30.0–36.0)
MCV: 90.7 fl (ref 78.0–100.0)
Monocytes Absolute: 0.4 10*3/uL (ref 0.1–1.0)
Monocytes Relative: 8.7 % (ref 3.0–12.0)
Neutro Abs: 2.6 10*3/uL (ref 1.4–7.7)
Neutrophils Relative %: 52.4 % (ref 43.0–77.0)
Platelets: 177 10*3/uL (ref 150.0–400.0)
RBC: 4.66 Mil/uL (ref 4.22–5.81)
RDW: 15.2 % (ref 11.5–15.5)
WBC: 5 10*3/uL (ref 4.0–10.5)

## 2018-04-19 LAB — POC URINALSYSI DIPSTICK (AUTOMATED)
Bilirubin, UA: NEGATIVE
Blood, UA: NEGATIVE
Glucose, UA: NEGATIVE
Ketones, UA: NEGATIVE
Leukocytes, UA: NEGATIVE
NITRITE UA: NEGATIVE
Protein, UA: POSITIVE — AB
Spec Grav, UA: >=1.03 — AB (ref 1.010–1.025)
Urobilinogen, UA: 0.2 E.U./dL
pH, UA: 5 (ref 5.0–8.0)

## 2018-04-19 LAB — PSA: PSA: 0.46 ng/mL (ref 0.10–4.00)

## 2018-04-19 LAB — LIPID PANEL
CHOLESTEROL: 186 mg/dL (ref 0–200)
HDL: 30.9 mg/dL — AB (ref >39.00)
NonHDL: 154.68
Total CHOL/HDL Ratio: 6
Triglycerides: 254 mg/dL — ABNORMAL HIGH (ref 0.0–149.0)
VLDL: 50.8 mg/dL — AB (ref 0.0–40.0)

## 2018-04-19 LAB — LDL CHOLESTEROL, DIRECT: Direct LDL: 131 mg/dL

## 2018-04-19 MED ORDER — IRBESARTAN 75 MG PO TABS: 75.0000 mg | ORAL_TABLET | Freq: Every day | ORAL | 3 refills | Status: DC

## 2018-04-19 MED ORDER — ROSUVASTATIN CALCIUM 10 MG PO TABS: 10.0000 mg | ORAL_TABLET | Freq: Every day | ORAL | 3 refills | Status: DC

## 2018-04-19 MED ORDER — PRAMIPEXOLE DIHYDROCHLORIDE 1 MG PO TABS: ORAL_TABLET | ORAL | 3 refills | Status: DC

## 2018-04-19 MED ORDER — FENOFIBRATE 145 MG PO TABS: 145.0000 mg | ORAL_TABLET | Freq: Every day | ORAL | 3 refills | Status: DC

## 2018-04-19 NOTE — Progress Notes (Signed)
Subjective:    Patient ID: Edward Olsen, male    DOB: October 20, 1955, 62 y.o.   MRN: [id number]  HPI Here for a well exam. His only complaint is feeling sleepy all day. He was diagnosed with sleep apnea in 2015 and he uses a CPAP machine, but it does not seem to be working well. He recently saw Dr. Chalmers Cater for his diabetes and his A1c is down to 7.0.    Review of Systems  Constitutional: Negative.   HENT: Negative.   Eyes: Negative.   Respiratory: Negative.   Cardiovascular: Negative.   Gastrointestinal: Negative.   Genitourinary: Negative.   Musculoskeletal: Negative.   Skin: Negative.   Neurological: Negative.   Psychiatric/Behavioral: Negative.        Objective:   Physical Exam Constitutional:      General: He is not in acute distress.    Appearance: He is well-developed. He is not diaphoretic.  HENT:     Head: Normocephalic and atraumatic.     Right Ear: External ear normal.     Left Ear: External ear normal.     Nose: Nose normal.     Mouth/Throat:     Pharynx: No oropharyngeal exudate.  Eyes:     General: No scleral icterus.       Right eye: No discharge.        Left eye: No discharge.     Conjunctiva/sclera: Conjunctivae normal.     Pupils: Pupils are equal, round, and reactive to light.  Neck:     Musculoskeletal: Neck supple.     Thyroid: No thyromegaly.     Vascular: No JVD.     Trachea: No tracheal deviation.  Cardiovascular:     Rate and Rhythm: Normal rate and regular rhythm.     Heart sounds: Normal heart sounds. No murmur. No friction rub. No gallop.   Pulmonary:     Effort: Pulmonary effort is normal. No respiratory distress.     Breath sounds: Normal breath sounds. No wheezing or rales.  Chest:     Chest wall: No tenderness.  Abdominal:     General: Bowel sounds are normal. There is no distension.     Palpations: Abdomen is soft. There is no mass.     Tenderness: There is no abdominal tenderness. There is no guarding or rebound.    Genitourinary:    Penis: Normal. No tenderness.      Prostate: Normal.     Rectum: Normal. Guaiac result negative.  Musculoskeletal: Normal range of motion.        General: No tenderness.  Lymphadenopathy:     Cervical: No cervical adenopathy.  Skin:    General: Skin is warm and dry.     Coloration: Skin is not pale.     Findings: No erythema or rash.  Neurological:     Mental Status: He is alert and oriented to person, place, and time.     Cranial Nerves: No cranial nerve deficit.     Motor: No abnormal muscle tone.     Coordination: Coordination normal.     Deep Tendon Reflexes: Reflexes are normal and symmetric. Reflexes normal.  Psychiatric:        Behavior: Behavior normal.        Thought Content: Thought content normal.        Judgment: Judgment normal.           Assessment & Plan:  Well exam. We discussed diet and exercise. He knows he needs  to lose weight. Get fasting labs. He was due for another colonoscopy this year, but his insurance will not pay for it so he wants to postpone this. Alysia Penna, MD

## 2018-04-22 ENCOUNTER — Telehealth: Payer: Self-pay | Admitting: *Deleted

## 2018-04-22 DIAGNOSIS — G4733 Obstructive sleep apnea (adult) (pediatric): Secondary | ICD-10-CM

## 2018-04-22 NOTE — Telephone Encounter (Signed)
Copied from [place] [id number]. Topic: Referral - Status >> Apr 22, 2018 10:14 AM Carolyn Stare wrote:  Pt is calling to follow up on his referral to have sleep study   >> Apr 22, 2018 12:07 PM Virl Cagey, CMA wrote: I do not see a Sleep referral in Epic. The last sleep referral placed was 2015.  Last CPE a GI referral was discussed and placed but not for sleep.   Please advise.

## 2018-04-22 NOTE — Telephone Encounter (Signed)
I did the referral 

## 2018-05-05 ENCOUNTER — Telehealth: Payer: Self-pay

## 2018-05-05 NOTE — Telephone Encounter (Signed)
Normal except for high cholesterol. Increase the Crestor to 20 mg daily. Call in #[phone] with 3 rf and recheck labs in 90 days

## 2018-05-05 NOTE — Telephone Encounter (Signed)
Called and spoke with pt and he stated that he was out of the cholesterol meds for almost 2 weeks prior to his lab work.  He wanted to make sure that Dr. Sarajane Jews still wanted to increase the meds.  Dr. Sarajane Jews please advise. Thanks

## 2018-05-05 NOTE — Telephone Encounter (Signed)
Copied from [place] [id number]. Topic: General - Other >> May 03, 2018  2:24 PM Valla Leaver wrote: Reason for CRM: Patient returning a call from [facility] regarding lab results. No note for [facility] to release.

## 2018-05-06 MED ORDER — ROSUVASTATIN CALCIUM 20 MG PO TABS: 20.0000 mg | ORAL_TABLET | Freq: Every day | ORAL | 3 refills | Status: DC

## 2018-05-06 NOTE — Telephone Encounter (Signed)
Med has been increased and I have called and lmom to make him aware.

## 2018-05-06 NOTE — Telephone Encounter (Signed)
Yes I still want to increase the dosage. Missing 2 weeks of the med would not make a huge difference in the results.

## 2018-05-17 ENCOUNTER — Encounter: Payer: Self-pay | Admitting: Pulmonary Disease

## 2018-05-17 ENCOUNTER — Ambulatory Visit (INDEPENDENT_AMBULATORY_CARE_PROVIDER_SITE_OTHER): Payer: No Typology Code available for payment source | Admitting: Pulmonary Disease

## 2018-05-17 VITALS — BP 126/82 | HR 78 | Ht 70.0 in | Wt 303.0 lb

## 2018-05-17 DIAGNOSIS — G4733 Obstructive sleep apnea (adult) (pediatric): Secondary | ICD-10-CM

## 2018-05-17 DIAGNOSIS — Z9989 Dependence on other enabling machines and devices: Secondary | ICD-10-CM | POA: Diagnosis not present

## 2018-05-17 NOTE — Patient Instructions (Signed)
Obstructive sleep apnea  We will set you up for home sleep study Treatment options will include an auto titrating CPAP  I will see you back in the office in about 3 months  Narcolepsy Narcolepsy is a neurological disorder that causes you to fall asleep suddenly, and without control, during the daytime (sleep attacks). Narcolepsy is a lifelong (chronic) disorder. Normally, sleep follows a regular cycle over the course of the night. After about 90 minutes of light sleep, your sleep should become deeper. When your sleep becomes deeper, your body moves less and you start dreaming. This type of deep sleep is called rapid eye movement (REM) sleep. When you have narcolepsy, your REM sleep is not well-regulated. This disrupts your sleep cycle, which causes daytime sleepiness. What are the causes? The cause of narcolepsy is not fully understood, but it may be related to:  Low levels of hypocretin, a chemical (neurotransmitter) in the brain that controls sleep and wake cycles. Hypocretin imbalance may be caused by: ? Abnormal genes that are passed from parent to child (inherited). ? The body's defense system (immune system) attacking hypocretin brain cells (autoimmune disease).  Infection, tumor, or injury in the area of the brain that controls sleep.  Exposure to poisons (toxins), such as heavy metals, pesticides, and secondhand smoke. What are the signs or symptoms? Symptoms of this condition include:  Excessive daytime sleepiness. This is the most common symptom and is usually the first symptom you will notice. This may affect your performance at work or school.  Sleep attacks. This means falling asleep suddenly and without control. You may fall asleep in the middle of an activity, especially low-energy activities like reading or watching TV.  Feeling like you cannot think clearly.  Trouble focusing or remembering things.  Feeling depressed.  Sudden muscle weakness (cataplexy). When this  occurs, your speech may become slurred, or your knees may buckle. Cataplexy is usually triggered by surprise, anger, fear, or laughter.  Loss of the ability to speak or move (sleep paralysis). This may occur just as you start to fall asleep or wake up. You will be aware of the paralysis. It usually lasts for just a few seconds or minutes.  Seeing, hearing, tasting, smelling, or feeling things that are not real (hallucinations). Hallucinations may occur with sleep paralysis. They can happen when you are falling asleep, waking up, or dozing.  Trouble staying asleep at night (insomnia).  Restless sleep. How is this diagnosed? This condition may be diagnosed based on:  A physical exam to rule out any other problems that may be causing your symptoms.You may be asked to write down your sleeping patterns for several weeks in a sleep diary. This will help your health care provider make a diagnosis.  Sleep studies that measure how well your REM sleep is regulated. These tests also measure your heart rate, breathing, movement, and brain waves. These tests include: ? An overnight sleep study (polysomnogram). ? A daytime sleep study that is done while you take several naps during the day (multiple sleep latency test, MSLT). This test measures how quickly you fall asleep and how quickly you enter REM sleep.  Removal of spinal fluid to measure hypocretin levels. How is this treated? There is no cure for this condition, but treatment can help relieve symptoms. Treatment may include:  Lifestyle and sleeping strategies to help you cope with the condition, such as: ? Exercising regularly. ? Maintaining a regular sleep schedule. ? Avoiding caffeine and large meals before bed.  Medicines. These may include: ? Medicines that help keep you awake and alert (stimulants) to fight daytime sleepiness. ? Medicines that treat depression (antidepressants). These may be used to treat cataplexy. ? Sodium oxybate.  This is a strong medicine to help you relax (sedative) that you may take at night. It can help control daytime sleepiness and cataplexy. Follow these instructions at home: Sleeping habits   Get about 8 hours of sleep every night.  Go to sleep and get up at about the same time every day.  Keep your bedroom dark, quiet, and comfortable.  When you feel very tired, take short naps. Schedule naps so that you take them at about the same time every day.  Tell your employer or teachers that you have narcolepsy. You may be able to adjust your schedule to include time for naps.  Before bedtime: ? Avoid bright lights and screens. ? Relax. Try activities like reading or taking a warm bath. Activity  Get at least 20 minutes of exercise every day. This will help you sleep better at night and reduce daytime sleepiness.  Avoid exercising within 3 hours of bedtime.  If you are sleepy, do not drive or use heavy machinery.  If possible, take a nap before driving.  Do not swim or go out on the water without a life jacket. Eating and drinking  Do not drink alcohol or caffeinated beverages within 4-5 hours of bedtime.  Do not eat a lot of food before bedtime. Eat meals at about the same times every day. General instructions   Take over-the-counter and prescription medicines only as told by your health care provider.  If directed, keep a sleep diary.  Tell your employer or teachers that you have narcolepsy. You may be able to adjust your schedule to include time for naps.  Do not use any products that contain nicotine or tobacco, such as cigarettes and e-cigarettes. If you need help quitting, ask your health care provider.  Keep all follow-up visits as told by your health care provider. This is important. Contact a health care provider if:  Your symptoms are not getting better.  You have increasingly high blood pressure (hypertension).  You have changes in your heart rhythm.  You are  having a hard time determining what is real and what is not (psychosis). Get help right away if:  You hurt yourself during a sleep attack or an attack of cataplexy.  You have chest pain.  You have trouble breathing. This information is not intended to replace advice given to you by your health care provider. Make sure you discuss any questions you have with your health care provider. Document Released: 04/10/2002 Document Revised: 04/13/2016 Document Reviewed: 04/13/2016 Elsevier Interactive Patient Education  2019 [employer]   Sleep Apnea Sleep apnea is a condition in which breathing pauses or becomes shallow during sleep. Episodes of sleep apnea usually last 10 seconds or longer, and they may occur as many as 20 times an hour. Sleep apnea disrupts your sleep and keeps your body from getting the rest that it needs. This condition can increase your risk of certain health problems, including:  Heart attack.  Stroke.  Obesity.  Diabetes.  Heart failure.  Irregular heartbeat. There are three kinds of sleep apnea:  Obstructive sleep apnea. This kind is caused by a blocked or collapsed airway.  Central sleep apnea. This kind happens when the part of the brain that controls breathing does not send the correct signals to the muscles that  control breathing.  Mixed sleep apnea. This is a combination of obstructive and central sleep apnea. What are the causes? The most common cause of this condition is a collapsed or blocked airway. An airway can collapse or become blocked if:  Your throat muscles are abnormally relaxed.  Your tongue and tonsils are larger than normal.  You are overweight.  Your airway is smaller than normal. What increases the risk? This condition is more likely to develop in people who:  Are overweight.  Smoke.  Have a smaller than normal airway.  Are elderly.  Are male.  Drink alcohol.  Take sedatives or tranquilizers.  Have a family history  of sleep apnea. What are the signs or symptoms? Symptoms of this condition include:  Trouble staying asleep.  Daytime sleepiness and tiredness.  Irritability.  Loud snoring.  Morning headaches.  Trouble concentrating.  Forgetfulness.  Decreased interest in sex.  Unexplained sleepiness.  Mood swings.  Personality changes.  Feelings of depression.  Waking up often during the night to urinate.  Dry mouth.  Sore throat. How is this diagnosed? This condition may be diagnosed with:  A medical history.  A physical exam.  A series of tests that are done while you are sleeping (sleep study). These tests are usually done in a sleep lab, but they may also be done at home. How is this treated? Treatment for this condition aims to restore normal breathing and to ease symptoms during sleep. It may involve managing health issues that can affect breathing, such as high blood pressure or obesity. Treatment may include:  Sleeping on your side.  Using a decongestant if you have nasal congestion.  Avoiding the use of depressants, including alcohol, sedatives, and narcotics.  Losing weight if you are overweight.  Making changes to your diet.  Quitting smoking.  Using a device to open your airway while you sleep, such as: ? An oral appliance. This is a custom-made mouthpiece that shifts your lower jaw forward. ? A continuous positive airway pressure (CPAP) device. This device delivers oxygen to your airway through a mask. ? A nasal expiratory positive airway pressure (EPAP) device. This device has valves that you put into each nostril. ? A bi-level positive airway pressure (BPAP) device. This device delivers oxygen to your airway through a mask.  Surgery if other treatments do not work. During surgery, excess tissue is removed to create a wider airway. It is important to get treatment for sleep apnea. Without treatment, this condition can lead to:  High blood  pressure.  Coronary artery disease.  (Men) An inability to achieve or maintain an erection (impotence).  Reduced thinking abilities. Follow these instructions at home:  Make any lifestyle changes that your health care provider recommends.  Eat a healthy, well-balanced diet.  Take over-the-counter and prescription medicines only as told by your health care provider.  Avoid using depressants, including alcohol, sedatives, and narcotics.  Take steps to lose weight if you are overweight.  If you were given a device to open your airway while you sleep, use it only as told by your health care provider.  Do not use any tobacco products, such as cigarettes, chewing tobacco, and e-cigarettes. If you need help quitting, ask your health care provider.  Keep all follow-up visits as told by your health care provider. This is important. Contact a health care provider if:  The device that you received to open your airway during sleep is uncomfortable or does not seem to be working.  Your symptoms do not improve.  Your symptoms get worse. Get help right away if:  You develop chest pain.  You develop shortness of breath.  You develop discomfort in your back, arms, or stomach.  You have trouble speaking.  You have weakness on one side of your body.  You have drooping in your face. These symptoms may represent a serious problem that is an emergency. Do not wait to see if the symptoms will go away. Get medical help right away. Call your local emergency services (911 in the [place] Do not drive yourself to the hospital. This information is not intended to replace advice given to you by your health care provider. Make sure you discuss any questions you have with your health care provider. Document Released: 04/10/2002 Document Revised: 11/16/2016 Document Reviewed: 01/28/2015 Elsevier Interactive Patient Education  2019 [employer].

## 2018-05-17 NOTE — Addendum Note (Signed)
Addended by: Madolyn Frieze on: 05/17/2018 12:23 PM   Modules accepted: Orders

## 2018-05-17 NOTE — Progress Notes (Addendum)
Edward Olsen    [id number]    Mar 31, 1956  Primary Care Physician:Fry, Ishmael Holter, MD  Referring Physician: Laurey Morale, MD [address] [place], [place] [address]  Chief complaint:   History of obstructive sleep apnea  HPI:  Patient with a history of obstructive sleep apnea still quite sleepy despite CPAP use Machine is about 63 years old Does not feel well rested following CPAP use, usually starts feeling sleepy about 2 hours after waking up  Usually goes to bed between 2 and 4 AM Falls asleep soon after Final wake up time about 8 AM Was diagnosed with obstructive sleep apnea in 2015  Gained weight since then  Does have occasional dryness of his mouth No headaches in the mornings  He had concerns about narcolepsy -Has no cataplexy, no sleep paralysis, no hallucinations, he has excessive daytime sleepiness   Outpatient Encounter Medications as of 05/17/2018  Medication Sig  . colchicine 0.6 MG tablet Take 1 tablet (0.6 mg total) by mouth every 6 (six) hours as needed (gout).  Marland Kitchen doxycycline (VIBRAMYCIN) 100 MG capsule TAKE 1 CAPSULE (100 MG TOTAL) BY MOUTH DAILY.  . fenofibrate (TRICOR) 145 MG tablet Take 1 tablet (145 mg total) by mouth daily.  . furosemide (LASIX) 40 MG tablet TAKE 1 TABLET BY MOUTH 3 TIMES A WEEK  . insulin NPH-regular Human (NOVOLIN 70/30) (70-30) 100 UNIT/ML injection Inject into the skin. 30/40/50 units  . irbesartan (AVAPRO) 75 MG tablet Take 1 tablet (75 mg total) by mouth daily.  Marland Kitchen levothyroxine (SYNTHROID, LEVOTHROID) 112 MCG tablet Take 224 mcg by mouth daily before breakfast.  . naproxen (NAPROSYN) 500 MG tablet Take 1 tablet (500 mg total) by mouth 2 (two) times daily with a meal.  . pramipexole (MIRAPEX) 1 MG tablet Take 5 tabs a day  . rosuvastatin (CRESTOR) 20 MG tablet Take 1 tablet (20 mg total) by mouth daily.  . SitaGLIPtin-MetFORMIN HCl (JANUMET XR) [phone] MG TB24 TAKE 1 TABLET BY MOUTH EVERY DAY   No  facility-administered encounter medications on file as of 05/17/2018.     Allergies as of 05/17/2018  . (No Known Allergies)    Past Medical History:  Diagnosis Date  . Diabetes mellitus    sees Dr. Chalmers Cater - type 2  . Hemorrhoids   . Hyperlipidemia   . Hypertension   . Normal cardiac stress test Jan. 2015    per Dr. Lou Miner   . Restless leg syndrome   . Rosacea   . Shingles Feb. 2015  . Sleep apnea    uses CPAP nightly  . Sleep apnea, obstructive    wears CPAP   . Thrombocytopenia [facility])    sees Dr. Concha Norway   . Thyroid disease     Past Surgical History:  Procedure Laterality Date  . CARPAL TUNNEL RELEASE Bilateral   . COLONOSCOPY  07/20/2012   per Dr. Ardis Hughs, adenomatous polyps, repeat in 5 yrs   . EYE SURGERY     PRK  . FLEXIBLE SIGMOIDOSCOPY  07-08-10   per Dr. Ardis Hughs, internal hemorrhoids   . [place]  2012  . HERNIA REPAIR    . LASIK    . SEPTOPLASTY  2013  . WISDOM TOOTH EXTRACTION      Family History  Problem Relation Age of Onset  . Diabetes Mother   . Cancer Mother        breast  . Alcohol abuse Mother   .  Heart disease Father        heart attack  . Alcohol abuse Maternal Grandmother   . Colon cancer Neg Hx   . Rectal cancer Neg Hx   . Stomach cancer Neg Hx     Social History   Socioeconomic History  . Marital status: Married    Spouse name: Lelon Frohlich  . Number of children: Not on file  . Years of education: Not on file  . Highest education level: Not on file  Occupational History  . Occupation: Scientist, clinical (histocompatibility and immunogenetics): [employer]  Social Needs  . Financial resource strain: Not on file  . Food insecurity:    Worry: Not on file    Inability: Not on file  . Transportation needs:    Medical: Not on file    Non-medical: Not on file  Tobacco Use  . Smoking status: Former Smoker    Packs/day: 1.00    Years: 15.00    Pack years: 15.00    Types: Cigars, Cigarettes    Last attempt to quit: 05/04/1985    Years since quitting: 33.0  .  Smokeless tobacco: Never Used  . Tobacco comment: maybe once a month smokes a cigar. Smoked cigarretes x 15 yrs 1 ppd quit in 1987//lmr, currently smokes pipe once in a while  Substance and Sexual Activity  . Alcohol use: Yes    Alcohol/week: 7.0 standard drinks    Types: 7 Standard drinks or equivalent per week    Comment: 1 drinks per day  . Drug use: No  . Sexual activity: Not on file  Lifestyle  . Physical activity:    Days per week: Not on file    Minutes per session: Not on file  . Stress: Not on file  Relationships  . Social connections:    Talks on phone: Not on file    Gets together: Not on file    Attends religious service: Not on file    Active member of club or organization: Not on file    Attends meetings of clubs or organizations: Not on file    Relationship status: Not on file  . Intimate partner violence:    Fear of current or ex partner: Not on file    Emotionally abused: Not on file    Physically abused: Not on file    Forced sexual activity: Not on file  Other Topics Concern  . Not on file  Social History Narrative  . Not on file    Review of Systems  Constitutional: Negative.   HENT: Negative.   Eyes: Negative.   Respiratory: Positive for apnea.   Cardiovascular: Negative.   Gastrointestinal: Negative.   Psychiatric/Behavioral: Positive for sleep disturbance.    Vitals:   05/17/18 1143  BP: 126/82  Pulse: 78  SpO2: 96%     Physical Exam  Constitutional: He appears well-developed and well-nourished.  Obese  HENT:  Head: Normocephalic and atraumatic.  Mallampati 4  Eyes: Pupils are equal, round, and reactive to light. Conjunctivae are normal. Right eye exhibits no discharge.  Neck: Normal range of motion. Neck supple. No tracheal deviation present. No thyromegaly present.  Neck is thick  Cardiovascular: Normal rate and regular rhythm.  Pulmonary/Chest: Effort normal and breath sounds normal. No respiratory distress. He has no wheezes.    Abdominal: Soft. Bowel sounds are normal. He exhibits no distension. There is no abdominal tenderness.    epworth sleepiness scale of 22  Assessment:  Obstructive sleep apnea  Excessive daytime sleepiness  Morbid obesity  Plan/Recommendations:  We will schedule patient for home sleep study  Narcolepsy and symptomatology discussed with the patient  Importance of weight loss and exercise discussed  Pathophysiology of sleep disordered breathing discussed Treatment options discussed  I will see him back in the office in about 3 months Encouraged to call if any significant concerns or questions   Sherrilyn Rist MD [facility] 05/17/2018, 12:07 PM  CC: Laurey Morale, MD

## 2018-06-13 ENCOUNTER — Telehealth: Payer: Self-pay | Admitting: Pulmonary Disease

## 2018-06-13 NOTE — Telephone Encounter (Signed)
Called and spoke with Patient.  He stated that he had to cancel his HST because it would cost him $500, and he can not afford that at this time.  He stated that at his last OV , Dr Ander Slade had mentioned a possible prescription for a stimulant med, because he is having a hard time staying awake during the day.  He is planning a couple of long road trips, and he is afraid that he may fall asleep.  Patient requested any prescription to be sent to [facility].  Message routed to Dr Ander Slade

## 2018-06-14 ENCOUNTER — Telehealth: Payer: Self-pay | Admitting: Pulmonary Disease

## 2018-06-14 MED ORDER — SOLRIAMFETOL HCL 75 MG PO TABS: ORAL_TABLET | ORAL | 2 refills | Status: DC

## 2018-06-14 MED ORDER — SOLRIAMFETOL HCL 75 MG PO TABS: 75.0000 mg | ORAL_TABLET | Freq: Every day | ORAL | 0 refills | Status: DC

## 2018-06-14 NOTE — Telephone Encounter (Signed)
Samples and discount card were provided nothing further needed at this time.

## 2018-06-14 NOTE — Telephone Encounter (Signed)
Patient be encouraged to continue using CPAP on a regular basis  Prescription for sunosi  37.5mg  daily for 3 days and may increase to 75mg  for desired effect 1 month supply with 1 refill  He has to continue using CPAP on a regular basis We may need to show that he is compliant with CPAP use for medicine to be covered.

## 2018-06-14 NOTE — Telephone Encounter (Signed)
Pt is calling asking for a update CB#  [phone]//[phone]

## 2018-06-14 NOTE — Telephone Encounter (Signed)
Prescription sent

## 2018-06-14 NOTE — Telephone Encounter (Signed)
Dr. Ander Slade patient is okay with trying Sunosi. Could you please send in the script   Patient reassured me that he is still using the cpap everyday.

## 2018-07-02 ENCOUNTER — Other Ambulatory Visit: Payer: Self-pay | Admitting: Family Medicine

## 2018-07-06 NOTE — Telephone Encounter (Signed)
Dr. Sarajane Jews please advise on refill of medication. thanks

## 2018-07-08 ENCOUNTER — Telehealth: Payer: Self-pay | Admitting: Pulmonary Disease

## 2018-07-08 NOTE — Telephone Encounter (Signed)
ATC pt, no answer. Left message for pt to call back.  

## 2018-07-12 ENCOUNTER — Other Ambulatory Visit: Payer: Self-pay | Admitting: Pulmonary Disease

## 2018-07-12 MED ORDER — SOLRIAMFETOL HCL 150 MG PO TABS: 150.0000 mg | ORAL_TABLET | Freq: Every day | ORAL | 1 refills | Status: DC

## 2018-07-12 NOTE — Telephone Encounter (Signed)
Called patient, unable to reach left message to give us a call back. 

## 2018-07-12 NOTE — Telephone Encounter (Signed)
We can increase the Sunosi to 150-this is the maximum dose  If it still is not working at 150 then we may need to look at other agents

## 2018-07-12 NOTE — Telephone Encounter (Signed)
Patient returned phone call.  Patient phone number is [phone].

## 2018-07-12 NOTE — Telephone Encounter (Signed)
Called and spoke with patient he is aware and verbalized understanding. Nothing further needed.  

## 2018-07-12 NOTE — Telephone Encounter (Signed)
Called and spoke with patient, he stated that he was given the samples of medication from [facility] to help him stay awake. At first he was doing well on them, then he noticed that they only worked when you were in the passenger seat then when the patient started driving he got really sleepy. Patient stated that the medication does not work for him at all now. The medication is Sunosi. Patient wants to know what he should do.   AO please advise, thank you.

## 2018-07-12 NOTE — Progress Notes (Signed)
Medication adjustment

## 2018-09-21 ENCOUNTER — Other Ambulatory Visit: Payer: Self-pay | Admitting: Family Medicine

## 2018-10-03 ENCOUNTER — Other Ambulatory Visit: Payer: Self-pay | Admitting: Pulmonary Disease

## 2018-10-06 NOTE — Telephone Encounter (Signed)
Patient got this refilled on 09/05/18 and it had 1 refill for this month so he should not need it refilled again until next month. Thanks.

## 2018-10-06 NOTE — Telephone Encounter (Signed)
Is this appropriate for refill ? 

## 2018-10-14 ENCOUNTER — Telehealth: Payer: Self-pay | Admitting: Pulmonary Disease

## 2018-10-14 NOTE — Telephone Encounter (Signed)
Attempted to call pt but unable to reach.left message for pt to return call. 

## 2018-10-17 ENCOUNTER — Other Ambulatory Visit: Payer: Self-pay | Admitting: Pulmonary Disease

## 2018-10-17 MED ORDER — SUNOSI 150 MG PO TABS: 150.0000 mg | ORAL_TABLET | Freq: Every day | ORAL | 1 refills | Status: DC

## 2018-10-17 NOTE — Telephone Encounter (Signed)
Left detailed message for patient letting him know that his RX has been sent in.   Will close this message.

## 2018-10-17 NOTE — Telephone Encounter (Signed)
Spoke with patient. He stated that he was requesting a refill on his Sunosi. Wishes to have this called into [facility] on [address].   Last OV: 05/07/18 with AO Next OV: None Last RX: 07/12/18 for 30 tabs with 1 refill   Beth, please advise if you are ok with providing him with a refill since AO is not back in the office yet. Thanks!

## 2018-10-17 NOTE — Telephone Encounter (Signed)
Forwarding to Dr. Ander Slade to refill sunosi

## 2018-10-17 NOTE — Telephone Encounter (Signed)
sunosi refilled

## 2018-10-17 NOTE — Telephone Encounter (Signed)
Thank you, letting triage know

## 2018-12-12 ENCOUNTER — Other Ambulatory Visit: Payer: Self-pay | Admitting: Pulmonary Disease

## 2018-12-12 NOTE — Telephone Encounter (Signed)
Is this appropriate for refill ? 

## 2019-01-16 ENCOUNTER — Ambulatory Visit: Payer: No Typology Code available for payment source | Admitting: Family Medicine

## 2019-01-16 ENCOUNTER — Encounter: Payer: Self-pay | Admitting: Family Medicine

## 2019-01-16 ENCOUNTER — Other Ambulatory Visit: Payer: Self-pay

## 2019-01-16 VITALS — BP 128/62 | Temp 96.5°F | Ht 70.0 in | Wt 304.0 lb

## 2019-01-16 DIAGNOSIS — G8929 Other chronic pain: Secondary | ICD-10-CM | POA: Diagnosis not present

## 2019-01-16 DIAGNOSIS — M25561 Pain in right knee: Secondary | ICD-10-CM | POA: Diagnosis not present

## 2019-01-16 NOTE — Progress Notes (Signed)
   Subjective:    Patient ID: Edward Olsen, male    DOB: [birth year], 63 y.o.   MRN: [id number]  HPI Here asking for a referral to Orthopedics for his right knee. He has had pain in both kjnees for years but the right knee has been the worst. He now has trouble getting through a day at work, since he is on his feet all day. He had Xrays of the knee at a urgent care a year ago and he was told then that it was "bone on bone". He used to take Naproxen for pain but Dr. Chalmers Cater told to only use Tylenol to reduce the stress on his kidneys.    Review of Systems  Constitutional: Negative.   Respiratory: Negative.   Cardiovascular: Negative.   Musculoskeletal: Positive for arthralgias.       Objective:   Physical Exam Constitutional:      Appearance: He is obese.  Cardiovascular:     Rate and Rhythm: Normal rate and regular rhythm.     Pulses: Normal pulses.     Heart sounds: Normal heart sounds.  Pulmonary:     Effort: Pulmonary effort is normal.     Breath sounds: Normal breath sounds.  Neurological:     Mental Status: He is alert.           Assessment & Plan:  Knee pain from osteoarthritis. Refer to Dr. Fredonia Highland. Alysia Penna, MD

## 2019-02-02 ENCOUNTER — Ambulatory Visit: Payer: No Typology Code available for payment source | Admitting: Pulmonary Disease

## 2019-02-21 ENCOUNTER — Other Ambulatory Visit: Payer: Self-pay | Admitting: Pulmonary Disease

## 2019-02-22 NOTE — Telephone Encounter (Signed)
[facility] Sunosi 150mg , take 1 by mouth daily, #30 with 1 refill. Last OV 05/17/18 for OSA on cpap.

## 2019-03-08 ENCOUNTER — Encounter: Payer: Self-pay | Admitting: Gastroenterology

## 2019-03-08 ENCOUNTER — Telehealth: Payer: Self-pay | Admitting: Pulmonary Disease

## 2019-03-08 NOTE — Telephone Encounter (Signed)
LMTCB.   Will leave in triage

## 2019-03-09 NOTE — Telephone Encounter (Signed)
Called and spoke to pt. Pt states he is now ready to schedule his HST. Order was placed in 05/2018 but he could not afford it then. Pt states he has met his deductible and thinks the cost will be cheaper and is ready to schedule.   PCCs please advise. Thanks.

## 2019-03-09 NOTE — Telephone Encounter (Signed)
Printed order and will give to [facility] to precert. Called pt and made him aware.  He also wants to verify if we are in network with his insurance.  I told him his insurance company should be able to tell him.  He states they won't.  I told him I will ask the lady who gets precert to see if she can verify if we are in network.  Told him we will call him in a couple of weeks to set up hst once precerted and we can get to him.  Nothing further needed.

## 2019-03-20 ENCOUNTER — Telehealth: Payer: Self-pay

## 2019-03-20 ENCOUNTER — Encounter: Payer: Self-pay | Admitting: Bariatrics

## 2019-03-20 ENCOUNTER — Ambulatory Visit (INDEPENDENT_AMBULATORY_CARE_PROVIDER_SITE_OTHER): Payer: No Typology Code available for payment source | Admitting: Bariatrics

## 2019-03-20 ENCOUNTER — Encounter (INDEPENDENT_AMBULATORY_CARE_PROVIDER_SITE_OTHER): Payer: Self-pay | Admitting: Bariatrics

## 2019-03-20 ENCOUNTER — Other Ambulatory Visit: Payer: Self-pay

## 2019-03-20 VITALS — BP 154/83 | HR 87 | Temp 98.6°F | Ht 69.0 in | Wt 306.0 lb

## 2019-03-20 DIAGNOSIS — E538 Deficiency of other specified B group vitamins: Secondary | ICD-10-CM

## 2019-03-20 DIAGNOSIS — Z0289 Encounter for other administrative examinations: Secondary | ICD-10-CM

## 2019-03-20 DIAGNOSIS — I10 Essential (primary) hypertension: Secondary | ICD-10-CM

## 2019-03-20 DIAGNOSIS — Z9189 Other specified personal risk factors, not elsewhere classified: Secondary | ICD-10-CM | POA: Diagnosis not present

## 2019-03-20 DIAGNOSIS — E119 Type 2 diabetes mellitus without complications: Secondary | ICD-10-CM | POA: Diagnosis not present

## 2019-03-20 DIAGNOSIS — E7849 Other hyperlipidemia: Secondary | ICD-10-CM

## 2019-03-20 DIAGNOSIS — R0602 Shortness of breath: Secondary | ICD-10-CM | POA: Diagnosis not present

## 2019-03-20 DIAGNOSIS — F3289 Other specified depressive episodes: Secondary | ICD-10-CM

## 2019-03-20 DIAGNOSIS — E559 Vitamin D deficiency, unspecified: Secondary | ICD-10-CM

## 2019-03-20 DIAGNOSIS — R5383 Other fatigue: Secondary | ICD-10-CM

## 2019-03-20 DIAGNOSIS — Z6841 Body Mass Index (BMI) 40.0 and over, adult: Secondary | ICD-10-CM

## 2019-03-20 NOTE — Telephone Encounter (Signed)
Copied from [place] [id number]. Topic: General - Other >> Mar 15, 2019 12:18 PM Rainey Pines A wrote: Mary from [place] services would like office notes for patient faxed to  [phone]. Marys callback [phone].

## 2019-03-21 NOTE — Progress Notes (Signed)
Office: [phone]  /  Fax: [phone]   Dear Dr. Edmonia Olsen,   Thank you for referring Edward Olsen to our clinic. The following note includes my evaluation and treatment recommendations.  HPI:   Chief Complaint: OBESITY    Edward Olsen has been referred by Dr. Edmonia Olsen for consultation regarding his obesity and obesity related comorbidities.    Edward Olsen (MR# [id number]) is a 63 y.o. male who presents on 03/21/2019 for obesity evaluation and treatment. Current BMI is Body mass index is 45.19 kg/m.Marland Kitchen Edward Olsen has been struggling with his weight for many years and has been unsuccessful in either losing weight, maintaining weight loss, or reaching his healthy weight goal.     Edward Olsen states he is currently in the action stage of change and ready to dedicate time achieving and maintaining a healthier weight. Edward Olsen is interested in becoming our patient and working on intensive lifestyle modifications including (but not limited to) diet, exercise and weight loss.     Edward Olsen does like to cook sometimes, but notes that he has time constraints. He craves meat, specifically "beef." He needs to lose 20-30 lbs for a right knee replacement.    Edward Olsen states his family eats meals together he thinks his family will eat healthier with him he struggles with family and or coworkers weight loss sabotage his desired weight loss is 106 lbs he has been heavy most of  his life he started gaining weight about 10 years ago his heaviest weight ever was 306 lbs. he craves beef he snacks frequently in the evenings he frequently makes poor food choices he has problems with excessive hunger  he frequently eats larger portions than normal  he struggles with emotional eating    Fatigue Edward Olsen feels his energy is lower than it should be. This has worsened with weight gain and has worsened recently. Edward Olsen admits to daytime somnolence and  admits to waking up still tired. Patient  has sleep apnea and is using CPAP. Patent has a history of symptoms of daytime fatigue. Patient generally gets 4-6 hours of sleep per night, and states they generally do not sleep well most nights. Snoring is present. Apneic episodes are not present. Epworth Sleepiness Score is 2.  Dyspnea on exertion Edward Olsen notes increasing shortness of breath with exercising and seems to be worsening over time with weight gain. He notes getting out of breath sooner with activity than he used to. This has gotten worse recently. Edward Olsen denies orthopnea.  Diabetes Mellitus without Complications Edward Olsen has a diagnosis of diabetes mellitus without complications and is taking Janumet XR and Novolin 70/30. Edward Olsen does not report checking his blood sugars. Last A1c was 8.0 per patient. He has been working on intensive lifestyle modifications including diet, exercise, and weight loss to help control his blood glucose levels.  Hypothyroidism Edward Olsen has a diagnosis of hypothyroidism, which is well controlled. He is taking Synthroid. He denies hot or cold intolerance or palpitations, but does admit to ongoing fatigue.  Hypertension Edward Olsen is a 62 y.o. male with hypertension and is taking Avapro.  Edward Olsen denies chest pain or shortness of breath on exertion. He is working weight loss to help control his blood pressure with the goal of decreasing his risk of heart attack and stroke. Edward Olsen's blood pressure is 154/83 (did not take medications today).  At risk for cardiovascular disease Edward Olsen is at a higher than average risk for cardiovascular disease due to obesity. He currently denies  any chest pain.  Hyperlipidemia Edward Olsen has hyperlipidemia and is taking Crestor. He reports a history of increased triglycerides - previously greater than 400 06/18. He has been trying to improve his cholesterol levels with intensive lifestyle modification including a low saturated fat diet, exercise and weight loss.  He denies any chest pain, claudication or myalgias.  Depression with emotional eating behaviors Edward Olsen is struggling with emotional eating and using food for comfort to the extent that it is negatively impacting his health. He often snacks when he is not hungry. Edward Olsen sometimes feels he is out of control and then feels guilty that he made poor food choices. He has been working on behavior modification techniques to help reduce his emotional eating and has been somewhat successful. Edward Olsen admits to emotional eating. PHQ-9 score is 26 today. He shows no sign of suicidal or homicidal ideations.  Depression Screen Edward Olsen Food and Mood (modified PHQ-9) score was 26. Depression screen PHQ 2/9 03/20/2019  Decreased Interest 3  Down, Depressed, Hopeless 3  PHQ - 2 Score 6  Altered sleeping 3  Tired, decreased energy 3  Change in appetite 2  Feeling bad or failure about yourself  3  Trouble concentrating 3  Moving slowly or fidgety/restless 3  Suicidal thoughts 3  PHQ-9 Score 26  Difficult doing work/chores Somewhat difficult   Vitamin B12 deficiency Edward Olsen has a diagnosis of B12 insufficiency and notes fatigue. He denies paresthesias.   Vitamin D deficiency Edward Olsen has a diagnosis of Vitamin D deficiency. He is not currently taking prescription Vit D and denies nausea, vomiting or muscle weakness.  ASSESSMENT AND PLAN:  Other fatigue - Plan: EKG 12-Lead  Shortness of breath on exertion  Type 2 diabetes mellitus without complication, without long-term current use of insulin (HCC) - Plan: C-peptide  Vitamin D deficiency - Plan: Vitamin D (25 hydroxy)  Essential hypertension  B12 nutritional deficiency - Plan: B12  Other hyperlipidemia  Other depression - with emotional eating  At risk for heart disease  Class 3 severe obesity with serious comorbidity and body mass index (BMI) of 45.0 to 49.9 in adult, unspecified obesity type ([place])  PLAN:  Fatigue Edward Olsen was  informed that his fatigue may be related to obesity, depression or many other causes. Labs will be ordered, and in the meanwhile Edward Olsen has agreed to work on diet, exercise and weight loss to help with fatigue. Proper sleep hygiene was discussed including the need for 7-8 hours of quality sleep each night. A sleep study was not ordered based on symptoms and Epworth score. Sina will gradually increase exercise and will use his CPAP nightly.  Dyspnea on exertion Edward Olsen's shortness of breath appears to be obesity related and exercise induced. He has agreed to work on weight loss and gradually increase exercise to treat his exercise induced shortness of breath. If Ryant follows our instructions and loses weight without improvement of his shortness of breath, we will plan to refer to pulmonology. We will monitor this condition regularly. Cillian agrees to this plan.  Diabetes Mellitus without Complications Edward Olsen has been given extensive diabetes education by myself today including ideal fasting and post-prandial blood glucose readings, individual ideal HgA1c goals  and hypoglycemia prevention. We discussed the importance of good blood sugar control to decrease the likelihood of diabetic complications such as nephropathy, neuropathy, limb loss, blindness, coronary artery disease, and death. We discussed the importance of intensive lifestyle modification including diet, exercise and weight loss as the first line treatment for diabetes. Kahari agrees  to continue his diabetes medications and will follow-up at the agreed upon time.  Hypothyroidism Edward Olsen was informed of the importance of good thyroid control to help with weight loss efforts. He was also informed that supertheraputic thyroid levels are dangerous and will not improve weight loss results. Ramier will continue his medications and follow-up as directed.  Hypertension We discussed sodium restriction, working on healthy weight loss, and a  regular exercise program as the means to achieve improved blood pressure control. Kabe agreed with this plan and agreed to follow up as directed. We will continue to monitor his blood pressure as well as his progress with the above lifestyle modifications. He will continue his medications as prescribed and will watch for signs of hypotension as he continues his lifestyle modifications.  Cardiovascular risk counseling Hikaru was given extended (15 minutes) coronary artery disease prevention counseling today. He is 63 y.o. male and has risk factors for heart disease including obesity. We discussed intensive lifestyle modifications today with an emphasis on specific weight loss instructions and strategies. Pt was also informed of the importance of increasing exercise and decreasing saturated fats to help prevent heart disease.  Hyperlipidemia Desmin was informed of the American Heart Association Guidelines emphasizing intensive lifestyle modifications as the first line treatment for hyperlipidemia. We discussed many lifestyle modifications today in depth, and Jasani will continue to work on decreasing saturated fats such as fatty red meat, butter and many fried foods. Brice will continue Crestor and follow-up as directed. He will also increase vegetables and lean protein in his diet and continue to work on exercise and weight loss efforts.  Depression with Emotional Eating Behaviors We discussed behavior modification techniques today to help [place] deal with his emotional eating and depression. Will consider referring Nil to Dr. Mallie Mussel in the future.  Depression Screen Martavis had a strongly positive depression screening. Depression is commonly associated with obesity and often results in emotional eating behaviors. We will monitor this closely and work on CBT to help improve the non-hunger eating patterns.   Vitamin B12 deficiency Edward Olsen will work on increasing B12 rich foods in his diet.  He will have B12 level checked today and follow-up as directed.  Vitamin D Deficiency Achille was informed that low Vitamin D levels contributes to fatigue and are associated with obesity, breast, and colon cancer. He will have routine testing of Vitamin D and follow-up with our clinic in 2-3 weeks.  Obesity Lancelot is currently in the action stage of change and his goal is to continue with weight loss efforts. I recommend Lorna begin the structured treatment plan as follows:  He has agreed to follow the Category 4 plan + 100 calories with additional breakfast options. Jeffary will work on meal planning, intentional eating, and will watch portion sizes.  Jacorri has been instructed to eventually work up to a goal of 150 minutes of combined cardio and strengthening exercise per week for weight loss and overall health benefits. We discussed the following Behavioral Modification Strategies today: increasing lean protein intake, decreasing simple carbohydrates, increasing vegetables, increase H20 intake, decrease eating out, no skipping meals, work on meal planning and easy cooking plans, and keeping healthy foods in the home.    He was informed of the importance of frequent follow-up visits to maximize his success with intensive lifestyle modifications for his multiple health conditions. He was informed we would discuss his lab results at his next visit unless there is a critical issue that needs to be addressed sooner. Edward Olsen  agreed to keep his next visit at the agreed upon time to discuss these results.  ALLERGIES: No Known Allergies  MEDICATIONS: Current Outpatient Medications on File Prior to Visit  Medication Sig Dispense Refill   fenofibrate (TRICOR) 145 MG tablet TAKE 1 TABLET DAILY 90 tablet 3   insulin NPH-regular Human (NOVOLIN 70/30) (70-30) 100 UNIT/ML injection Inject into the skin. 30/40/50 units     irbesartan (AVAPRO) 75 MG tablet Take 1 tablet (75 mg total) by mouth  daily. 90 tablet 3   levothyroxine (SYNTHROID, LEVOTHROID) 112 MCG tablet Take 224 mcg by mouth daily before breakfast.     pramipexole (MIRAPEX) 1 MG tablet TAKE 5 TABLETS AT BEDTIME 450 tablet 3   rosuvastatin (CRESTOR) 20 MG tablet Take 1 tablet (20 mg total) by mouth daily. 90 tablet 3   SitaGLIPtin-MetFORMIN HCl (JANUMET XR) [phone] MG TB24 TAKE 1 TABLET BY MOUTH EVERY DAY     colchicine 0.6 MG tablet Take 1 tablet (0.6 mg total) by mouth every 6 (six) hours as needed (gout). (Patient not taking: Reported on 03/20/2019) 60 tablet 2   furosemide (LASIX) 40 MG tablet TAKE 1 TABLET BY MOUTH 3 TIMES A WEEK 90 tablet 3   naproxen (NAPROSYN) 500 MG tablet Take 1 tablet (500 mg total) by mouth 2 (two) times daily with a meal. 60 tablet 11   SUNOSI 150 MG TABS TAKE 1 TABLET BY MOUTH EVERY DAY (Patient not taking: Reported on 03/20/2019) 30 tablet 1   No current facility-administered medications on file prior to visit.     PAST MEDICAL HISTORY: Past Medical History:  Diagnosis Date   Arthritis    Back pain    Diabetes mellitus    sees Dr. Chalmers Cater - type 2   Edema, lower extremity    Hemorrhoids    Hyperlipidemia    Hypertension    Hypothyroidism    Joint pain    Kidney problem    Knee pain    Normal cardiac stress test Jan. 2015    per Dr. Lou Miner    Obesity    Restless leg syndrome    Rosacea    Shingles Feb. 2015   Sleep apnea    uses CPAP nightly   Sleep apnea, obstructive    wears CPAP    SOB (shortness of breath)    Thrombocytopenia ([place])    sees Dr. Shanon Brow Chism    Thyroid disease     PAST SURGICAL HISTORY: Past Surgical History:  Procedure Laterality Date   CARPAL TUNNEL RELEASE Bilateral    COLONOSCOPY  07/20/2012   per Dr. Ardis Hughs, adenomatous polyps, repeat in 5 yrs    EYE SURGERY     [place]  07-08-10   per Dr. Ardis Hughs, internal hemorrhoids    HEMORRHOID SURGERY  2012   [facility]     LASIK      SEPTOPLASTY  2013   WISDOM TOOTH EXTRACTION      SOCIAL HISTORY: Social History   Tobacco Use   Smoking status: Former Smoker    Packs/day: 1.00    Years: 15.00    Pack years: 15.00    Types: Cigars, Cigarettes    Quit date: 05/04/1985    Years since quitting: 33.9   Smokeless tobacco: Never Used   Tobacco comment: maybe once a month smokes a cigar. Smoked cigarretes x 15 yrs 1 ppd quit in 1987//lmr, currently smokes pipe once in a while  Substance Use Topics   Alcohol use:  Yes    Alcohol/week: 7.0 standard drinks    Types: 7 Standard drinks or equivalent per week    Comment: 1 drinks per day   Drug use: No    FAMILY HISTORY: Family History  Problem Relation Age of Onset   Diabetes Mother    Cancer Mother        breast   Alcohol abuse Mother    Stroke Mother    Obesity Mother    Heart disease Father        heart attack   High blood pressure Father    High Cholesterol Father    Alcohol abuse Maternal Grandmother    Colon cancer Neg Hx    Rectal cancer Neg Hx    Stomach cancer Neg Hx    ROS: Review of Systems  Constitutional: Positive for malaise/fatigue.  Eyes: Positive for pain.       Positive for wearing glasses or contacts. Positive for floaters.  Respiratory: Negative for shortness of breath (with activity).   Cardiovascular: Negative for chest pain, palpitations, orthopnea and claudication.  Gastrointestinal: Negative for nausea and vomiting.  Musculoskeletal: Positive for neck pain. Negative for myalgias.       Negative for muscle weakness. Positive for neck stiffness.  Neurological: Positive for tremors.       Negative for paresthesias. Positive for leg cramping.  Endo/Heme/Allergies:       Negative for hot/cold intolerance.  Psychiatric/Behavioral: Positive for depression (emotional eating). Negative for suicidal ideas. The patient has insomnia.        Negative for homicidal ideas. Positive for stress.   PHYSICAL EXAM: Blood  pressure (!) 154/83, pulse 87, temperature 98.6 F (37 C), height 5\' 9"  (1.753 m), weight (!) 306 lb (138.8 kg), SpO2 97 %. Body mass index is 45.19 kg/m. Physical Exam Vitals signs reviewed.  Constitutional:      Appearance: Normal appearance. He is well-developed. He is obese.  HENT:     Head: Normocephalic and atraumatic.     Nose: Nose normal.  Eyes:     General: No scleral icterus. Neck:     Musculoskeletal: Normal range of motion.  Cardiovascular:     Rate and Rhythm: Normal rate and regular rhythm.  Pulmonary:     Effort: Pulmonary effort is normal. No respiratory distress.  Abdominal:     Palpations: Abdomen is soft.     Tenderness: There is no abdominal tenderness.  Musculoskeletal: Normal range of motion.     Comments: Range of motion normal in all four extremities.  Skin:    General: Skin is warm and dry.  Neurological:     Mental Status: He is alert and oriented to person, place, and time.     Coordination: Coordination normal.  Psychiatric:        Mood and Affect: Mood and affect normal.        Behavior: Behavior normal.   RECENT LABS AND TESTS: BMET    Component Value Date/Time   NA 139 04/19/2018 1457   NA 139 03/22/2013 0948   K 4.4 04/19/2018 1457   K 4.2 03/22/2013 0948   CL 104 04/19/2018 1457   CO2 27 04/19/2018 1457   CO2 25 03/22/2013 0948   GLUCOSE 136 (H) 04/19/2018 1457   GLUCOSE 106 03/22/2013 0948   BUN 21 04/19/2018 1457   BUN 23.2 03/22/2013 0948   CREATININE 1.14 04/19/2018 1457   CREATININE 1.10 12/13/2015 1012   CREATININE 1.3 03/22/2013 0948   CALCIUM 10.0 04/19/2018 1457  CALCIUM 10.5 (H) 03/22/2013 0948   GFRNONAA 65.14 12/17/2009 0808   GFRAA 101 09/20/2007 0820   Lab Results  Component Value Date   HGBA1C 7.4 (H) 12/13/2015   No results found for: INSULIN CBC    Component Value Date/Time   WBC 5.0 04/19/2018 1457   RBC 4.66 04/19/2018 1457   HGB 14.1 04/19/2018 1457   HGB 13.6 04/20/2013 1009   HCT 42.2  04/19/2018 1457   HCT 39.6 04/20/2013 1009   PLT 177.0 04/19/2018 1457   PLT 159 04/20/2013 1009   MCV 90.7 04/19/2018 1457   MCV 93.4 04/20/2013 1009   MCH 30.3 12/13/2015 1012   MCHC 33.5 04/19/2018 1457   RDW 15.2 04/19/2018 1457   RDW 13.3 04/20/2013 1009   LYMPHSABS 1.7 04/19/2018 1457   LYMPHSABS 1.8 04/20/2013 1009   MONOABS 0.4 04/19/2018 1457   MONOABS 0.6 04/20/2013 1009   EOSABS 0.2 04/19/2018 1457   EOSABS 0.1 04/20/2013 1009   BASOSABS 0.0 04/19/2018 1457   BASOSABS 0.0 04/20/2013 1009   Iron/TIBC/Ferritin/ %Sat No results found for: IRON, TIBC, FERRITIN, IRONPCTSAT Lipid Panel     Component Value Date/Time   CHOL 186 04/19/2018 1457   TRIG 254.0 (H) 04/19/2018 1457   HDL 30.90 (L) 04/19/2018 1457   CHOLHDL 6 04/19/2018 1457   VLDL 50.8 (H) 04/19/2018 1457   LDLCALC 89 11/06/2016 0914   LDLDIRECT 131.0 04/19/2018 1457   Hepatic Function Panel     Component Value Date/Time   PROT 6.9 04/19/2018 1457   PROT 7.6 03/22/2013 0948   ALBUMIN 4.7 04/19/2018 1457   ALBUMIN 4.6 03/22/2013 0948   AST 53 (H) 04/19/2018 1457   AST 20 03/22/2013 0948   ALT 91 (H) 04/19/2018 1457   ALT 31 03/22/2013 0948   ALKPHOS 56 04/19/2018 1457   ALKPHOS 43 03/22/2013 0948   BILITOT 0.5 04/19/2018 1457   BILITOT 0.42 03/22/2013 0948   BILIDIR 0.1 04/19/2018 1457   IBILI 0.4 12/13/2015 1012      Component Value Date/Time   TSH 2.01 10/02/2016 1046   TSH 1.01 12/13/2015 1012   TSH 0.54 09/05/2012 1403   No results found for: Vitamin D, 25-Hydroxy  ECG  Shows sinus rhythm with a rate of 77 BPM. Low voltage in limb leads may be  R/T body habitus.  -  Nonspecific T-abnormality. Oherwise within normal limits.   INDIRECT CALORIMETER done today shows a VO2 of 407 and a REE of 2833.  His calculated basal metabolic rate is 123456 thus his basal metabolic rate is better than expected.  OBESITY BEHAVIORAL INTERVENTION VISIT  Today's visit was #1  Starting weight: 306  lbs Starting date: 03/20/2019 Today's weight: 306 lbs  Today's date: 03/20/2019 Total lbs lost to date: 0    03/20/2019  Height 5\' 9"  (1.753 m)  Weight 306 lb (138.8 kg) (A)  BMI (Calculated) 45.17  BLOOD PRESSURE - SYSTOLIC 123456  BLOOD PRESSURE - DIASTOLIC 83  Waist Measurement  54 inches   Body Fat % 45 %  RMR 2833   ASK: We discussed the diagnosis of obesity with Jeani Hawking today and Edward Olsen agreed to give [place] permission to discuss obesity behavioral modification therapy today.  ASSESS: Wilber has the diagnosis of obesity and his BMI today is 45.2. Nieves is in the action stage of change.   ADVISE: Azi was educated on the multiple health risks of obesity as well as the benefit of weight loss to improve his health. He  was advised of the need for long term treatment and the importance of lifestyle modifications to improve his current health and to decrease his risk of future health problems.  AGREE: Multiple dietary modification options and treatment options were discussed and  Martavis agreed to follow the recommendations documented in the above note.  ARRANGE: Cleophus was educated on the importance of frequent visits to treat obesity as outlined per CMS and USPSTF guidelines and agreed to schedule his next follow up appointment today.  Migdalia Dk, am acting as Location manager for [employer], DO  I have reviewed the above documentation for accuracy and completeness, and I agree with the above. -Jearld Lesch, DO

## 2019-03-22 ENCOUNTER — Encounter (INDEPENDENT_AMBULATORY_CARE_PROVIDER_SITE_OTHER): Payer: Self-pay | Admitting: Bariatrics

## 2019-03-22 NOTE — Telephone Encounter (Signed)
Stanton Kidney called again to check the status of the request. She stated she received the Rx but needs the last 2 OV notes to be faxed to [phone] please fax asap

## 2019-03-23 NOTE — Telephone Encounter (Signed)
Tried faxing PPW 2x with neg outcome. Will try again later.

## 2019-03-28 DIAGNOSIS — G4730 Sleep apnea, unspecified: Secondary | ICD-10-CM | POA: Insufficient documentation

## 2019-03-28 NOTE — Telephone Encounter (Signed)
Edward Olsen with [facility] called and wanted to let Edward Olsen know that she still have not received the paperwork that was requested, if she can please resend it again today. Fax # [phone].

## 2019-03-28 NOTE — Telephone Encounter (Signed)
PPW has been faxed to [facility].

## 2019-04-03 ENCOUNTER — Other Ambulatory Visit: Payer: Self-pay

## 2019-04-03 ENCOUNTER — Ambulatory Visit (INDEPENDENT_AMBULATORY_CARE_PROVIDER_SITE_OTHER): Payer: No Typology Code available for payment source | Admitting: Bariatrics

## 2019-04-03 VITALS — BP 158/86 | HR 86 | Temp 98.3°F | Ht 69.0 in | Wt 298.0 lb

## 2019-04-03 DIAGNOSIS — Z6841 Body Mass Index (BMI) 40.0 and over, adult: Secondary | ICD-10-CM

## 2019-04-03 DIAGNOSIS — Z9189 Other specified personal risk factors, not elsewhere classified: Secondary | ICD-10-CM | POA: Diagnosis not present

## 2019-04-03 DIAGNOSIS — E7849 Other hyperlipidemia: Secondary | ICD-10-CM

## 2019-04-03 DIAGNOSIS — E119 Type 2 diabetes mellitus without complications: Secondary | ICD-10-CM | POA: Diagnosis not present

## 2019-04-03 DIAGNOSIS — I10 Essential (primary) hypertension: Secondary | ICD-10-CM

## 2019-04-03 MED ORDER — TOPIRAMATE 50 MG PO TABS: 50.0000 mg | ORAL_TABLET | Freq: Every day | ORAL | 0 refills | Status: DC

## 2019-04-04 ENCOUNTER — Telehealth: Payer: Self-pay | Admitting: Pulmonary Disease

## 2019-04-04 ENCOUNTER — Encounter (INDEPENDENT_AMBULATORY_CARE_PROVIDER_SITE_OTHER): Payer: Self-pay | Admitting: Bariatrics

## 2019-04-04 NOTE — Telephone Encounter (Signed)
Spoke to pt & made him aware that due to increase in covid cases our director has told us to not schedule any home sleep studies at this time.  Told him we would call him when we are able to schedule again.  Nothing further needed.

## 2019-04-04 NOTE — Progress Notes (Signed)
Office: [phone]  /  Fax: [phone]   HPI:   Chief Complaint: OBESITY Edward Olsen is here to discuss his progress with his obesity treatment plan. He is on the Category 4 plan + 100 calories with breakfast options and is following his eating plan approximately 50 % of the time. He states he is exercising 0 minutes 0 times per week. Edward Olsen is down 8 lbs. He has struggled with the plan. He wants to snack after dinner (bad things). He struggles with sweets.  His weight is 298 lb (135.2 kg) today and has had a weight loss of 8 pounds over a period of 2 weeks since his last visit. He has lost 8 lbs since starting treatment with [place].  Hypertension Edward Olsen is a 63 y.o. male with hypertension. Edward Olsen's blood pressure was 154/83 last visit, and today it is 158/86 (not well controlled). He is taking Avapro and denies chest pain. He is working on weight loss to help control his blood pressure with the goal of decreasing his risk of heart attack and stroke.   Diabetes II Edward Olsen has a diagnosis of diabetes type II. Edward Olsen states his fasting BGs range in 150's and 2 hour post prandials range in 200's. He denies hypoglycemia. He is taking Janumet XR and Novolin 70/30. He has been working on intensive lifestyle modifications including diet, exercise, and weight loss to help control his blood glucose levels.  Hyperlipidemia (Mixed) Edward Olsen has hyperlipidemia and has been trying to improve his cholesterol levels with intensive lifestyle modification including a low saturated fat diet, exercise and weight loss. His last triglycerides was 254. He is taking fenofibrate and Crestor. He denies any chest pain, claudication or myalgias.  At risk for cardiovascular disease Edward Olsen is at a higher than average risk for cardiovascular disease due to obesity, hypertension, diabetes II, and hyperlipidemia. He currently denies any chest pain.  Depression with Emotional Eating Behaviors Edward Olsen notes night  time eating and denies contradictions. Edward Olsen struggles with emotional eating and using food for comfort to the extent that it is negatively impacting his health. He often snacks when he is not hungry. Edward Olsen sometimes feels he is out of control and then feels guilty that he made poor food choices. He has been working on behavior modification techniques to help reduce his emotional eating and has been somewhat successful. He shows no sign of suicidal or homicidal ideations.  Depression screen Edward Olsen 2/9 03/20/2019 04/19/2018  Decreased Interest 3 0  Down, Depressed, Hopeless 3 0  PHQ - 2 Score 6 0  Altered sleeping 3 -  Tired, decreased energy 3 -  Change in appetite 2 -  Feeling bad or failure about yourself  3 -  Trouble concentrating 3 -  Moving slowly or fidgety/restless 3 -  Suicidal thoughts 3 -  PHQ-9 Score 26 -  Difficult doing work/chores Somewhat difficult -    ASSESSMENT AND PLAN:  Essential hypertension  Type 2 diabetes mellitus without complication, without long-term current use of insulin (HCC)  Other hyperlipidemia  At risk for heart disease  Class 3 severe obesity with serious comorbidity and body mass index (BMI) of 40.0 to 44.9 in adult, unspecified obesity type (HCC)  PLAN:  Hypertension We discussed sodium restriction, working on healthy weight loss, and a regular exercise program as the means to achieve improved blood pressure control. Edward Olsen agreed with this plan and agreed to follow up as directed. We will continue to monitor his blood pressure as well as his progress  with the above lifestyle modifications. Edward Olsen agrees to continue his medications and will watch for signs of hypotension as he continues his lifestyle modifications. Edward Olsen agrees to follow up with our clinic in 2 to 3 weeks.  Diabetes II Edward Olsen has been given extensive diabetes education by myself today including ideal fasting and post-prandial blood glucose readings, individual ideal Hgb  A1c goals and hypoglycemia prevention. We discussed the importance of good blood sugar control to decrease the likelihood of diabetic complications such as nephropathy, neuropathy, limb loss, blindness, coronary artery disease, and death. We discussed the importance of intensive lifestyle modification including diet, exercise and weight loss as the first line treatment for diabetes. Desiree agrees to continue his diabetes medications and will adjust his insulin appropriately. He will continue checking his fasting BGs and 2 hour post prandials. Edward Olsen agrees to follow up with our clinic in 2 to 3 weeks.  Hyperlipidemia (Mixed) Edward Olsen was informed of the American Heart Association Guidelines emphasizing intensive lifestyle modifications as the first line treatment for hyperlipidemia. We discussed many lifestyle modifications today in depth, and Edward Olsen will continue to work on decreasing saturated fats such as fatty red meat, butter and many fried foods. He will also increase vegetables and lean protein in his diet and continue to work on exercise and weight loss efforts. Edward Olsen agrees to continue his medications, and he agrees to follow up with our clinic in 2 to 3 weeks.  Cardiovascular risk counseling Edward Olsen was given extended (15 minutes) coronary artery disease prevention counseling today. He is 63 y.o. male and has risk factors for heart disease including obesity, hypertension, diabetes II, and hyperlipidemia. We discussed intensive lifestyle modifications today with an emphasis on specific weight loss instructions and strategies. Pt was also informed of the importance of increasing exercise and decreasing saturated fats to help prevent heart disease.  Depression with Emotional Eating Behaviors We discussed behavior modification techniques today to help Edward Olsen deal with his emotional eating behaviors. Edward Olsen agrees to start Topamax 50 mg 1 tablet PO with dinner daily #30 with no refills. Edward Olsen  agrees to follow up with our clinic in 2 to 3 weeks.  Obesity Edward Olsen is currently in the action stage of change. As such, his goal is to continue with weight loss efforts He has agreed to follow the Category 4 plan + 100 calories with breakfast options Edward Olsen has been instructed to work up to a goal of 150 minutes of combined cardio and strengthening exercise per week for weight loss and overall health benefits. We discussed the following Behavioral Modification Strategies today: increasing lean protein intake, decreasing simple carbohydrates , increasing vegetables, decrease eating out, work on meal planning and easy cooking plans and avoiding temptations, increase H20 intake, no skipping meals, keeping healthy foods in the home, and planning for success   Toa has agreed to follow up with our clinic in 2 to 3 weeks. He was informed of the importance of frequent follow up visits to maximize his success with intensive lifestyle modifications for his multiple health conditions.  ALLERGIES: No Known Allergies  MEDICATIONS: Current Outpatient Medications on File Prior to Visit  Medication Sig Dispense Refill  . colchicine 0.6 MG tablet Take 1 tablet (0.6 mg total) by mouth every 6 (six) hours as needed (gout). 60 tablet 2  . fenofibrate (TRICOR) 145 MG tablet TAKE 1 TABLET DAILY 90 tablet 3  . furosemide (LASIX) 40 MG tablet TAKE 1 TABLET BY MOUTH 3 TIMES A WEEK 90 tablet 3  .  insulin NPH-regular Human (NOVOLIN 70/30) (70-30) 100 UNIT/ML injection Inject into the skin. 30/40/50 units    . irbesartan (AVAPRO) 75 MG tablet Take 1 tablet (75 mg total) by mouth daily. 90 tablet 3  . levothyroxine (SYNTHROID, LEVOTHROID) 112 MCG tablet Take 224 mcg by mouth daily before breakfast.    . naproxen (NAPROSYN) 500 MG tablet Take 1 tablet (500 mg total) by mouth 2 (two) times daily with a meal. 60 tablet 11  . pramipexole (MIRAPEX) 1 MG tablet TAKE 5 TABLETS AT BEDTIME 450 tablet 3  . rosuvastatin  (CRESTOR) 20 MG tablet Take 1 tablet (20 mg total) by mouth daily. 90 tablet 3  . SitaGLIPtin-MetFORMIN HCl (JANUMET XR) [phone] MG TB24 TAKE 1 TABLET BY MOUTH EVERY DAY    . SUNOSI 150 MG TABS TAKE 1 TABLET BY MOUTH EVERY DAY 30 tablet 1   No current facility-administered medications on file prior to visit.     PAST MEDICAL HISTORY: Past Medical History:  Diagnosis Date  . Arthritis   . Back pain   . Diabetes mellitus    sees Dr. Chalmers Cater - type 2  . Edema, lower extremity   . Hemorrhoids   . Hyperlipidemia   . Hypertension   . Hypothyroidism   . Joint pain   . Kidney problem   . Knee pain   . Normal cardiac stress test Jan. 2015    per Dr. Lou Miner   . Obesity   . Restless leg syndrome   . Rosacea   . Shingles Feb. 2015  . Sleep apnea    uses CPAP nightly  . Sleep apnea, obstructive    wears CPAP   . SOB (shortness of breath)   . Thrombocytopenia [facility])    sees Dr. Concha [place]   . Thyroid disease     PAST SURGICAL HISTORY: Past Surgical History:  Procedure Laterality Date  . CARPAL TUNNEL RELEASE Bilateral   . COLONOSCOPY  07/20/2012   per Dr. Ardis Hughs, adenomatous polyps, repeat in 5 yrs   . EYE SURGERY     PRK  . FLEXIBLE SIGMOIDOSCOPY  07-08-10   per Dr. Ardis Hughs, internal hemorrhoids   . [place]  2012  . HERNIA REPAIR    . LASIK    . SEPTOPLASTY  2013  . WISDOM TOOTH EXTRACTION      SOCIAL HISTORY: Social History   Tobacco Use  . Smoking status: Former Smoker    Packs/day: 1.00    Years: 15.00    Pack years: 15.00    Types: Cigars, Cigarettes    Quit date: 05/04/1985    Years since quitting: 33.9  . Smokeless tobacco: Never Used  . Tobacco comment: maybe once a month smokes a cigar. Smoked cigarretes x 15 yrs 1 ppd quit in 1987//lmr, currently smokes pipe once in a while  Substance Use Topics  . Alcohol use: Yes    Alcohol/week: 7.0 standard drinks    Types: 7 Standard drinks or equivalent per week    Comment: 1 drinks per day  . Drug use: No     FAMILY HISTORY: Family History  Problem Relation Age of Onset  . Diabetes Mother   . Cancer Mother        breast  . Alcohol abuse Mother   . Stroke Mother   . Obesity Mother   . Heart disease Father        heart attack  . High blood pressure Father   . High Cholesterol Father   .  Alcohol abuse Maternal Grandmother   . Colon cancer Neg Hx   . Rectal cancer Neg Hx   . Stomach cancer Neg Hx     ROS: Review of Systems  Constitutional: Positive for weight loss.  Cardiovascular: Negative for chest pain and claudication.  Musculoskeletal: Negative for myalgias.  Endo/Heme/Allergies:       Negative hypoglycemia  Psychiatric/Behavioral: Positive for depression. Negative for suicidal ideas.    PHYSICAL EXAM: Blood pressure (!) 158/86, pulse 86, temperature 98.3 F (36.8 C), height 5\' 9"  (1.753 m), weight 298 lb (135.2 kg), SpO2 97 %. Body mass index is 44.01 kg/m. Physical Exam Vitals signs reviewed.  Constitutional:      Appearance: Normal appearance. He is obese.  Cardiovascular:     Rate and Rhythm: Normal rate.     Pulses: Normal pulses.  Pulmonary:     Effort: Pulmonary effort is normal.     Breath sounds: Normal breath sounds.  Musculoskeletal: Normal range of motion.  Skin:    General: Skin is warm and dry.  Neurological:     Mental Status: He is alert and oriented to person, place, and time.  Psychiatric:        Mood and Affect: Mood normal.        Behavior: Behavior normal.     RECENT LABS AND TESTS: BMET    Component Value Date/Time   NA 139 04/19/2018 1457   NA 139 03/22/2013 0948   K 4.4 04/19/2018 1457   K 4.2 03/22/2013 0948   CL 104 04/19/2018 1457   CO2 27 04/19/2018 1457   CO2 25 03/22/2013 0948   GLUCOSE 136 (H) 04/19/2018 1457   GLUCOSE 106 03/22/2013 0948   BUN 21 04/19/2018 1457   BUN 23.2 03/22/2013 0948   CREATININE 1.14 04/19/2018 1457   CREATININE 1.10 12/13/2015 1012   CREATININE 1.3 03/22/2013 0948   CALCIUM 10.0  04/19/2018 1457   CALCIUM 10.5 (H) 03/22/2013 0948   GFRNONAA 65.14 12/17/2009 0808   GFRAA 101 09/20/2007 0820   Lab Results  Component Value Date   HGBA1C 7.4 (H) 12/13/2015   HGBA1C 7.4 (H) 06/26/2014   HGBA1C 5.9 12/22/2011   HGBA1C 5.9 12/17/2009   HGBA1C 5.5 09/20/2007   No results found for: INSULIN CBC    Component Value Date/Time   WBC 5.0 04/19/2018 1457   RBC 4.66 04/19/2018 1457   HGB 14.1 04/19/2018 1457   HGB 13.6 04/20/2013 1009   HCT 42.2 04/19/2018 1457   HCT 39.6 04/20/2013 1009   PLT 177.0 04/19/2018 1457   PLT 159 04/20/2013 1009   MCV 90.7 04/19/2018 1457   MCV 93.4 04/20/2013 1009   MCH 30.3 12/13/2015 1012   MCHC 33.5 04/19/2018 1457   RDW 15.2 04/19/2018 1457   RDW 13.3 04/20/2013 1009   LYMPHSABS 1.7 04/19/2018 1457   LYMPHSABS 1.8 04/20/2013 1009   MONOABS 0.4 04/19/2018 1457   MONOABS 0.6 04/20/2013 1009   EOSABS 0.2 04/19/2018 1457   EOSABS 0.1 04/20/2013 1009   BASOSABS 0.0 04/19/2018 1457   BASOSABS 0.0 04/20/2013 1009   Iron/TIBC/Ferritin/ %Sat No results found for: IRON, TIBC, FERRITIN, IRONPCTSAT Lipid Panel     Component Value Date/Time   CHOL 186 04/19/2018 1457   TRIG 254.0 (H) 04/19/2018 1457   HDL 30.90 (L) 04/19/2018 1457   CHOLHDL 6 04/19/2018 1457   VLDL 50.8 (H) 04/19/2018 1457   LDLCALC 89 11/06/2016 0914   LDLDIRECT 131.0 04/19/2018 1457   Hepatic Function Panel  Component Value Date/Time   PROT 6.9 04/19/2018 1457   PROT 7.6 03/22/2013 0948   ALBUMIN 4.7 04/19/2018 1457   ALBUMIN 4.6 03/22/2013 0948   AST 53 (H) 04/19/2018 1457   AST 20 03/22/2013 0948   ALT 91 (H) 04/19/2018 1457   ALT 31 03/22/2013 0948   ALKPHOS 56 04/19/2018 1457   ALKPHOS 43 03/22/2013 0948   BILITOT 0.5 04/19/2018 1457   BILITOT 0.42 03/22/2013 0948   BILIDIR 0.1 04/19/2018 1457   IBILI 0.4 12/13/2015 1012      Component Value Date/Time   TSH 2.01 10/02/2016 1046   TSH 1.01 12/13/2015 1012   TSH 0.54 09/05/2012 1403       OBESITY BEHAVIORAL INTERVENTION VISIT  Today's visit was # 2   Starting weight: 306 lbs Starting date: 03/20/2019 Today's weight : 298 lbs Today's date: 04/03/2019 Total lbs lost to date: 8    ASK: We discussed the diagnosis of obesity with Edward Olsen today and Edward Fortis agreed to give [place] permission to discuss obesity behavioral modification therapy today.  ASSESS: Zyir has the diagnosis of obesity and his BMI today is 43.99 Jurgen is in the action stage of change   ADVISE: Griffin was educated on the multiple health risks of obesity as well as the benefit of weight loss to improve his health. He was advised of the need for long term treatment and the importance of lifestyle modifications to improve his current health and to decrease his risk of future health problems.  AGREE: Multiple dietary modification options and treatment options were discussed and  Chaise agreed to follow the recommendations documented in the above note.  ARRANGE: Jhamir was educated on the importance of frequent visits to treat obesity as outlined per CMS and USPSTF guidelines and agreed to schedule his next follow up appointment today.  Edward Olsen, am acting as transcriptionist for [employer], DO  I have reviewed the above documentation for accuracy and completeness, and I agree with the above. -Jearld Lesch, DO

## 2019-04-10 ENCOUNTER — Other Ambulatory Visit: Payer: Self-pay

## 2019-04-10 ENCOUNTER — Ambulatory Visit (AMBULATORY_SURGERY_CENTER): Payer: No Typology Code available for payment source | Admitting: *Deleted

## 2019-04-10 VITALS — Temp 96.6°F | Ht 69.0 in | Wt 302.4 lb

## 2019-04-10 DIAGNOSIS — Z1159 Encounter for screening for other viral diseases: Secondary | ICD-10-CM

## 2019-04-10 DIAGNOSIS — Z86010 Personal history of colonic polyps: Secondary | ICD-10-CM

## 2019-04-10 MED ORDER — SUPREP BOWEL PREP KIT 17.5-3.13-1.6 GM/177ML PO SOLN: 1.0000 | Freq: Once | ORAL | 0 refills | Status: AC

## 2019-04-10 NOTE — Progress Notes (Signed)
No egg or soy allergy known to patient  No issues with past sedation with any surgeries  or procedures, no intubation problems  No diet pills per patient No home 02 use per patient  No blood thinners per patient  Pt denies issues with constipation  No A fib or A flutter  EMMI video sent to pt's e mail  suprep coupon $15  Due to the COVID-19 pandemic we are asking patients to follow these guidelines. Please only bring one care partner. Please be aware that your care partner may wait in the car in the parking lot or if they feel like they will be too hot to wait in the car, they may wait in the lobby on the 4th floor. All care partners are required to wear a mask the entire time (we do not have any that we can provide them), they need to practice social distancing, and we will do a Covid check for all patient's and care partners when you arrive. Also we will check their temperature and your temperature. If the care partner waits in their car they need to stay in the parking lot the entire time and we will call them on their cell phone when the patient is ready for discharge so they can bring the car to the front of the building. Also all patient's will need to wear a mask into building.

## 2019-04-11 ENCOUNTER — Encounter: Payer: Self-pay | Admitting: Gastroenterology

## 2019-04-11 ENCOUNTER — Encounter (INDEPENDENT_AMBULATORY_CARE_PROVIDER_SITE_OTHER): Payer: Self-pay | Admitting: Bariatrics

## 2019-04-11 DIAGNOSIS — E559 Vitamin D deficiency, unspecified: Secondary | ICD-10-CM | POA: Insufficient documentation

## 2019-04-11 LAB — VITAMIN B12: Vitamin B-12: 345 pg/mL (ref 200–1100)

## 2019-04-11 LAB — C-PEPTIDE: C-Peptide: 3.97 ng/mL — ABNORMAL HIGH (ref 0.80–3.85)

## 2019-04-11 LAB — VITAMIN D 25 HYDROXY (VIT D DEFICIENCY, FRACTURES): Vit D, 25-Hydroxy: 16 ng/mL — ABNORMAL LOW (ref 30–100)

## 2019-04-17 ENCOUNTER — Ambulatory Visit (INDEPENDENT_AMBULATORY_CARE_PROVIDER_SITE_OTHER): Payer: No Typology Code available for payment source | Admitting: Bariatrics

## 2019-04-17 ENCOUNTER — Encounter (INDEPENDENT_AMBULATORY_CARE_PROVIDER_SITE_OTHER): Payer: Self-pay | Admitting: Bariatrics

## 2019-04-17 ENCOUNTER — Other Ambulatory Visit: Payer: Self-pay

## 2019-04-17 VITALS — BP 122/67 | HR 76 | Temp 97.8°F | Ht 69.0 in | Wt 291.0 lb

## 2019-04-17 DIAGNOSIS — E119 Type 2 diabetes mellitus without complications: Secondary | ICD-10-CM

## 2019-04-17 DIAGNOSIS — F3289 Other specified depressive episodes: Secondary | ICD-10-CM

## 2019-04-17 DIAGNOSIS — Z6841 Body Mass Index (BMI) 40.0 and over, adult: Secondary | ICD-10-CM

## 2019-04-17 DIAGNOSIS — Z9189 Other specified personal risk factors, not elsewhere classified: Secondary | ICD-10-CM | POA: Diagnosis not present

## 2019-04-17 DIAGNOSIS — R748 Abnormal levels of other serum enzymes: Secondary | ICD-10-CM | POA: Diagnosis not present

## 2019-04-17 DIAGNOSIS — E559 Vitamin D deficiency, unspecified: Secondary | ICD-10-CM | POA: Diagnosis not present

## 2019-04-17 MED ORDER — VITAMIN D (ERGOCALCIFEROL) 1.25 MG (50000 UNIT) PO CAPS: 50000.0000 [IU] | ORAL_CAPSULE | ORAL | 0 refills | Status: DC

## 2019-04-17 NOTE — Progress Notes (Signed)
Office: [phone]  /  Fax: [phone]   HPI:  Chief Complaint: OBESITY Edward Olsen is here to discuss his progress with his obesity treatment plan. He is on the Category 4 plan + 100 calories and states he is following his eating plan approximately 70% of the time. He states he is exercising 0 minutes 0 times per week.  Edward Olsen is down 7 lbs since his last visit and is doing well overall.  Today's visit was #3 Starting weight: 306 lbs Starting date: 03/20/2019 Today's weight: 291 lbs  Today's date: 04/17/2019 Total lbs lost to date: 15 Total lbs lost since last in-office visit: 7  Elevated Liver Enzymes Mox has elevated liver enzymes and is on no new medications. Last AST 53 on 04/19/2018 with an ALT of 91.  Vitamin D deficiency Edward Olsen has a diagnosis of Vitamin D deficiency and is taking prescription Vitamin D. Last Vitamin D was 16 on 04/10/2019. He denies nausea, vomiting, or muscle weakness.  At risk for osteopenia and osteoporosis Edward Olsen is at higher risk of osteopenia and osteoporosis due to Vitamin D deficiency.   Type II Diabetes Mellitus Dequanta states his fasting blood sugars range between 140 and 170's. He is taking Novolin 70/30. C-Peptide was 3.97 on 04/10/2019. Last A1c was 8.0 per the patient.  Other Depression (Emotional Eating Behaviors) Edward Olsen has decreased emotional eating and states Topamax is helping with his cravings.  ASSESSMENT AND PLAN:  Elevated liver enzymes  Vitamin D deficiency - Plan: Vitamin D, Ergocalciferol, (DRISDOL) 1.25 MG (50000 UT) CAPS capsule  Type 2 diabetes mellitus without complication, without long-term current use of insulin (HCC)  Other depression, emotional eating  At risk for osteoporosis  Class 3 severe obesity with serious comorbidity and body mass index (BMI) of 40.0 to 44.9 in adult, unspecified obesity type ([place])  PLAN:  Elevated Liver Enzymes We discussed "fatty liver" and the impact of 5-10% weight  loss and exercise would have on this. Will recheck AST/ALT in 2-3 months.  Vitamin D Deficiency Edward Olsen was informed that low Vitamin D levels contributes to fatigue and are associated with obesity, breast, and colon cancer. He agrees to continue to take prescription Vit D @ 50,000 IU every week #4 with 0 refills and will follow-up for routine testing of Vitamin D, at least 2-3 times per year. He was informed of the risk of over-replacement of Vitamin D and agrees to not increase his dose unless he discusses this with [place] first. Edward Olsen agrees to follow-up with our clinic in 3 weeks.  At risk for osteopenia and osteoporosis Edward Olsen was given extended  (15 minutes) osteoporosis prevention counseling today. Edward Olsen is at risk for osteopenia and osteoporosis due to his Vitamin D deficiency. He was encouraged to take his Vitamin D and follow his higher calcium diet and increase strengthening exercise to help strengthen his bones and decrease his risk of osteopenia and osteoporosis.  Diabetes II Edward Olsen has been given diabetes education by myself today. Good blood sugar control is important to decrease the likelihood of diabetic complications such as nephropathy, neuropathy, limb loss, blindness, coronary artery disease, and death. Intensive lifestyle modification including diet, exercise and weight loss were discussed as the first line treatment for diabetes. Edward Olsen will continue his medications as prescribed and follow-up as directed.  Emotional Eating Behaviors (other depression) Behavior modification techniques were discussed today to help [name] deal with his emotional/non-hunger eating behaviors. [name] will continue his Topamax and will continue to follow and monitor his progress.  Obesity  Edward Olsen is currently in the action stage of change. As such, his goal is to continue with weight loss efforts. He has agreed to follow the Category 4 plan + 100 calories. Edward Olsen will work on meal  planning. We reviewed his lab results including CMP, lipids, CBC, and C-Peptide. Edward Olsen has been instructed to work up to a goal of 150 minutes of combined cardio and strengthening exercise per week for weight loss and overall health benefits. We discussed the following Behavioral Modification Strategies today: increasing lean protein intake, decreasing simple carbohydrates, increasing vegetables, increase H20 intake, decrease eating out, no skipping meals, work on meal planning and easy cooking plans, and keeping healthy foods in the home.  Edward Olsen has agreed to follow-up with our clinic in 3 weeks. He was informed of the importance of frequent follow-up visits to maximize his success with intensive lifestyle modifications for his multiple health conditions.  ALLERGIES: No Known Allergies  MEDICATIONS: Current Outpatient Medications on File Prior to Visit  Medication Sig Dispense Refill  . BD VEO INSULIN SYRINGE U/F 31G X 15/64" 1 ML MISC USE WITH INSULIN THREE TIMES A DAY    . colchicine 0.6 MG tablet Take 1 tablet (0.6 mg total) by mouth every 6 (six) hours as needed (gout). 60 tablet 2  . doxycycline (VIBRAMYCIN) 100 MG capsule Take 100 mg by mouth daily.    . fenofibrate (TRICOR) 145 MG tablet TAKE 1 TABLET DAILY 90 tablet 3  . furosemide (LASIX) 40 MG tablet TAKE 1 TABLET BY MOUTH 3 TIMES A WEEK 90 tablet 3  . ibuprofen (ADVIL) 600 MG tablet Take 600 mg by mouth every 6 (six) hours as needed.    . insulin NPH-regular Human (NOVOLIN 70/30) (70-30) 100 UNIT/ML injection Inject into the skin. 65/45/75 units    . irbesartan (AVAPRO) 75 MG tablet Take 1 tablet (75 mg total) by mouth daily. 90 tablet 3  . levothyroxine (SYNTHROID, LEVOTHROID) 112 MCG tablet Take 224 mcg by mouth daily before breakfast.    . naproxen (NAPROSYN) 500 MG tablet Take 1 tablet (500 mg total) by mouth 2 (two) times daily with a meal. 60 tablet 11  . pramipexole (MIRAPEX) 1 MG tablet TAKE 5 TABLETS AT BEDTIME 450  tablet 3  . rosuvastatin (CRESTOR) 20 MG tablet Take 1 tablet (20 mg total) by mouth daily. 90 tablet 3  . SitaGLIPtin-MetFORMIN HCl (JANUMET XR) [phone] MG TB24 TAKE 1 TABLET BY MOUTH EVERY DAY    . SUNOSI 150 MG TABS TAKE 1 TABLET BY MOUTH EVERY DAY 30 tablet 1  . topiramate (TOPAMAX) 50 MG tablet Take 1 tablet (50 mg total) by mouth daily. 30 tablet 0   No current facility-administered medications on file prior to visit.    PAST MEDICAL HISTORY: Past Medical History:  Diagnosis Date  . Arthritis   . Back pain   . Cataract    forming  . Diabetes mellitus    sees Dr. Chalmers Cater - type 2  . Edema, lower extremity   . Glaucoma    very mild-no treatment  . Hemorrhoids   . Hyperlipidemia   . Hypertension   . Hypothyroidism   . Joint pain   . Kidney problem   . Knee pain   . Normal cardiac stress test Jan. 2015    per Dr. Lou Miner   . Obesity   . Restless leg syndrome   . Rosacea   . Shingles Feb. 2015  . Sleep apnea    uses CPAP nightly  .  Sleep apnea, obstructive    wears CPAP   . SOB (shortness of breath)   . Thrombocytopenia [facility])    sees Dr. Concha Norway   . Thyroid disease     PAST SURGICAL HISTORY: Past Surgical History:  Procedure Laterality Date  . CARPAL TUNNEL RELEASE Bilateral   . COLONOSCOPY  07/20/2012   per Dr. Ardis Hughs, adenomatous polyps, repeat in 5 yrs   . EYE SURGERY     PRK  . FLEXIBLE SIGMOIDOSCOPY  07-08-10   per Dr. Ardis Hughs, internal hemorrhoids   . [place]  2012  . HERNIA REPAIR    . LASIK    . POLYPECTOMY    . SEPTOPLASTY  2013  . SIGMOIDOSCOPY    . WISDOM TOOTH EXTRACTION      SOCIAL HISTORY: Social History   Tobacco Use  . Smoking status: Former Smoker    Packs/day: 1.00    Years: 15.00    Pack years: 15.00    Types: Cigars, Cigarettes    Quit date: 05/04/1985    Years since quitting: 33.9  . Smokeless tobacco: Former Systems developer  . Tobacco comment: maybe once a month smokes a cigar. Smoked cigarretes x 15 yrs 1 ppd quit in  1987//lmr, currently smokes pipe once in a while  Substance Use Topics  . Alcohol use: Yes    Alcohol/week: 7.0 standard drinks    Types: 7 Standard drinks or equivalent per week    Comment: 1 drinks per day or less  . Drug use: No    FAMILY HISTORY: Family History  Problem Relation Age of Onset  . Diabetes Mother   . Cancer Mother        breast  . Alcohol abuse Mother   . Stroke Mother   . Obesity Mother   . Breast cancer Mother   . Heart disease Father        heart attack  . High blood pressure Father   . High Cholesterol Father   . Alcohol abuse Maternal Grandmother   . Colon cancer Neg Hx   . Rectal cancer Neg Hx   . Stomach cancer Neg Hx   . Colon polyps Neg Hx   . Esophageal cancer Neg Hx    ROS: Review of Systems  Gastrointestinal: Negative for nausea and vomiting.  Musculoskeletal:       Negative for muscle weakness.  Psychiatric/Behavioral: Positive for depression (emotional eating).   PHYSICAL EXAM: Blood pressure 122/67, pulse 76, temperature 97.8 F (36.6 C), height 5\' 9"  (1.753 m), weight 291 lb (132 kg), SpO2 97 %. Body mass index is 42.97 kg/m. Physical Exam Vitals reviewed.  Constitutional:      Appearance: Normal appearance. He is obese.  Cardiovascular:     Rate and Rhythm: Normal rate.     Pulses: Normal pulses.  Pulmonary:     Effort: Pulmonary effort is normal.     Breath sounds: Normal breath sounds.  Musculoskeletal:        General: Normal range of motion.  Skin:    General: Skin is warm and dry.  Neurological:     Mental Status: He is alert and oriented to person, place, and time.  Psychiatric:        Behavior: Behavior normal.   RECENT LABS AND TESTS: BMET    Component Value Date/Time   NA 139 04/19/2018 1457   NA 139 03/22/2013 0948   K 4.4 04/19/2018 1457   K 4.2 03/22/2013 0948   CL 104 04/19/2018 1457  CO2 27 04/19/2018 1457   CO2 25 03/22/2013 0948   GLUCOSE 136 (H) 04/19/2018 1457   GLUCOSE 106 03/22/2013 0948    BUN 21 04/19/2018 1457   BUN 23.2 03/22/2013 0948   CREATININE 1.14 04/19/2018 1457   CREATININE 1.10 12/13/2015 1012   CREATININE 1.3 03/22/2013 0948   CALCIUM 10.0 04/19/2018 1457   CALCIUM 10.5 (H) 03/22/2013 0948   GFRNONAA 65.14 12/17/2009 0808   GFRAA 101 09/20/2007 0820   Lab Results  Component Value Date   HGBA1C 7.4 (H) 12/13/2015   HGBA1C 7.4 (H) 06/26/2014   HGBA1C 5.9 12/22/2011   HGBA1C 5.9 12/17/2009   HGBA1C 5.5 09/20/2007   No results found for: INSULIN CBC    Component Value Date/Time   WBC 5.0 04/19/2018 1457   RBC 4.66 04/19/2018 1457   HGB 14.1 04/19/2018 1457   HGB 13.6 04/20/2013 1009   HCT 42.2 04/19/2018 1457   HCT 39.6 04/20/2013 1009   PLT 177.0 04/19/2018 1457   PLT 159 04/20/2013 1009   MCV 90.7 04/19/2018 1457   MCV 93.4 04/20/2013 1009   MCH 30.3 12/13/2015 1012   MCHC 33.5 04/19/2018 1457   RDW 15.2 04/19/2018 1457   RDW 13.3 04/20/2013 1009   LYMPHSABS 1.7 04/19/2018 1457   LYMPHSABS 1.8 04/20/2013 1009   MONOABS 0.4 04/19/2018 1457   MONOABS 0.6 04/20/2013 1009   EOSABS 0.2 04/19/2018 1457   EOSABS 0.1 04/20/2013 1009   BASOSABS 0.0 04/19/2018 1457   BASOSABS 0.0 04/20/2013 1009   Iron/TIBC/Ferritin/ %Sat No results found for: IRON, TIBC, FERRITIN, IRONPCTSAT Lipid Panel     Component Value Date/Time   CHOL 186 04/19/2018 1457   TRIG 254.0 (H) 04/19/2018 1457   HDL 30.90 (L) 04/19/2018 1457   CHOLHDL 6 04/19/2018 1457   VLDL 50.8 (H) 04/19/2018 1457   LDLCALC 89 11/06/2016 0914   LDLDIRECT 131.0 04/19/2018 1457   Hepatic Function Panel     Component Value Date/Time   PROT 6.9 04/19/2018 1457   PROT 7.6 03/22/2013 0948   ALBUMIN 4.7 04/19/2018 1457   ALBUMIN 4.6 03/22/2013 0948   AST 53 (H) 04/19/2018 1457   AST 20 03/22/2013 0948   ALT 91 (H) 04/19/2018 1457   ALT 31 03/22/2013 0948   ALKPHOS 56 04/19/2018 1457   ALKPHOS 43 03/22/2013 0948   BILITOT 0.5 04/19/2018 1457   BILITOT 0.42 03/22/2013 0948   BILIDIR  0.1 04/19/2018 1457   IBILI 0.4 12/13/2015 1012      Component Value Date/Time   TSH 2.01 10/02/2016 1046   TSH 1.01 12/13/2015 1012   TSH 0.54 09/05/2012 1403    OBESITY BEHAVIORAL INTERVENTION VISIT DOCUMENTATION FOR INSURANCE (~15 minutes)  I, Michaelene Song, am acting as Location manager for [employer], DO  I have reviewed the above documentation for accuracy and completeness, and I agree with the above. Jearld Lesch, DO

## 2019-04-18 ENCOUNTER — Encounter (INDEPENDENT_AMBULATORY_CARE_PROVIDER_SITE_OTHER): Payer: Self-pay | Admitting: Bariatrics

## 2019-04-19 ENCOUNTER — Ambulatory Visit (INDEPENDENT_AMBULATORY_CARE_PROVIDER_SITE_OTHER): Payer: No Typology Code available for payment source

## 2019-04-19 ENCOUNTER — Other Ambulatory Visit: Payer: Self-pay | Admitting: Gastroenterology

## 2019-04-19 ENCOUNTER — Other Ambulatory Visit: Payer: Self-pay | Admitting: Family Medicine

## 2019-04-19 DIAGNOSIS — Z1159 Encounter for screening for other viral diseases: Secondary | ICD-10-CM

## 2019-04-20 LAB — SARS CORONAVIRUS 2 (TAT 6-24 HRS): SARS Coronavirus 2: NEGATIVE

## 2019-04-24 ENCOUNTER — Encounter: Payer: Self-pay | Admitting: Gastroenterology

## 2019-04-24 ENCOUNTER — Other Ambulatory Visit: Payer: Self-pay

## 2019-04-24 ENCOUNTER — Ambulatory Visit (AMBULATORY_SURGERY_CENTER): Payer: No Typology Code available for payment source | Admitting: Gastroenterology

## 2019-04-24 VITALS — BP 97/68 | HR 66 | Temp 97.5°F | Resp 18 | Ht 69.0 in | Wt 302.4 lb

## 2019-04-24 DIAGNOSIS — D122 Benign neoplasm of ascending colon: Secondary | ICD-10-CM

## 2019-04-24 DIAGNOSIS — D123 Benign neoplasm of transverse colon: Secondary | ICD-10-CM

## 2019-04-24 DIAGNOSIS — Z86010 Personal history of colonic polyps: Secondary | ICD-10-CM | POA: Diagnosis present

## 2019-04-24 MED ORDER — SODIUM CHLORIDE 0.9 % IV SOLN: 500.0000 mL | Freq: Once | INTRAVENOUS | Status: DC

## 2019-04-24 NOTE — Op Note (Signed)
[place] Patient Name: Rayshawn Rosato Procedure Date: 04/24/2019 8:20 AM MRN: [id number] Endoscopist: Milus Banister , MD Age: 63 Referring MD:  Date of Birth: 08-11-1955 Gender: Male Account #: [id number] Procedure:                Colonoscopy Indications:              High risk colon cancer surveillance: Personal                            history of colonic polyps; Colonoscopy 2008 single                            adenoma, colonoscopy 07/2012 single subCM adenoma                            and one HP Medicines:                Monitored Anesthesia Care Procedure:                Pre-Anesthesia Assessment:                           - Prior to the procedure, a History and Physical                            was performed, and patient medications and                            allergies were reviewed. The patient's tolerance of                            previous anesthesia was also reviewed. The risks                            and benefits of the procedure and the sedation                            options and risks were discussed with the patient.                            All questions were answered, and informed consent                            was obtained. Prior Anticoagulants: The patient has                            taken no previous anticoagulant or antiplatelet                            agents. ASA Grade Assessment: III - A patient with                            severe systemic disease. After reviewing the risks  and benefits, the patient was deemed in                            satisfactory condition to undergo the procedure.                           After obtaining informed consent, the colonoscope                            was passed under direct vision. Throughout the                            procedure, the patient's blood pressure, pulse, and                            oxygen saturations were monitored continuously. The                             Colonoscope was introduced through the anus and                            advanced to the the cecum, identified by                            appendiceal orifice and ileocecal valve. The                            colonoscopy was performed without difficulty. The                            patient tolerated the procedure well. The quality                            of the bowel preparation was good. The ileocecal                            valve, appendiceal orifice, and rectum were                            photographed. Scope In: 8:24:49 AM Scope Out: 8:45:18 AM Scope Withdrawal Time: 0 hours 12 minutes 40 seconds  Total Procedure Duration: 0 hours 20 minutes 29 seconds  Findings:                 Four sessile polyps were found in the transverse                            colon and ascending colon. The polyps were 2 to 4                            mm in size. These polyps were removed with a cold                            snare. Resection and retrieval were complete.  Multiple small and large-mouthed diverticula were                            found in the entire colon.                           The exam was otherwise without abnormality on                            direct and retroflexion views. Complications:            No immediate complications. Estimated blood loss:                            None. Estimated Blood Loss:     Estimated blood loss: none. Impression:               - Four 2 to 4 mm polyps in the transverse colon and                            in the ascending colon, removed with a cold snare.                            Resected and retrieved.                           - Diverticulosis in the entire examined colon.                           - The examination was otherwise normal on direct                            and retroflexion views. Recommendation:           - Patient has a contact number available for                             emergencies. The signs and symptoms of potential                            delayed complications were discussed with the                            patient. Return to normal activities tomorrow.                            Written discharge instructions were provided to the                            patient.                           - Resume previous diet.                           - Continue present medications.                           -  Await pathology results. Milus Banister, MD 04/24/2019 8:48:21 AM This report has been signed electronically.

## 2019-04-24 NOTE — Progress Notes (Signed)
0825 IV infiltrated, 22g placed in right hand without problems, vss

## 2019-04-24 NOTE — Patient Instructions (Signed)
YOU HAD AN ENDOSCOPIC PROCEDURE TODAY AT THE [facility]:   Refer to the procedure report that was given to you for any specific questions about what was found during the examination.  If the procedure report does not answer your questions, please call your gastroenterologist to clarify.  If you requested that your care partner not be given the details of your procedure findings, then the procedure report has been included in a sealed envelope for you to review at your convenience later.  YOU SHOULD EXPECT: Some feelings of bloating in the abdomen. Passage of more gas than usual.  Walking can help get rid of the air that was put into your GI tract during the procedure and reduce the bloating. If you had a lower endoscopy (such as a colonoscopy or flexible sigmoidoscopy) you may notice spotting of blood in your stool or on the toilet paper. If you underwent a bowel prep for your procedure, you may not have a normal bowel movement for a few days.  Please Note:  You might notice some irritation and congestion in your nose or some drainage.  This is from the oxygen used during your procedure.  There is no need for concern and it should clear up in a day or so.  SYMPTOMS TO REPORT IMMEDIATELY:   Following lower endoscopy (colonoscopy or flexible sigmoidoscopy):  Excessive amounts of blood in the stool  Significant tenderness or worsening of abdominal pains  Swelling of the abdomen that is new, acute  Fever of 100F or higher  For urgent or emergent issues, a gastroenterologist can be reached at any hour by calling [phone].   DIET:  We do recommend a small meal at first, but then you may proceed to your regular diet.  Drink plenty of fluids but you should avoid alcoholic beverages for 24 hours.  ACTIVITY:  You should plan to take it easy for the rest of today and you should NOT DRIVE or use heavy machinery until tomorrow (because of the sedation medicines used during the test).     FOLLOW UP: Our staff will call the number listed on your records 48-72 hours following your procedure to check on you and address any questions or concerns that you may have regarding the information given to you following your procedure. If we do not reach you, we will leave a message.  We will attempt to reach you two times.  During this call, we will ask if you have developed any symptoms of COVID 19. If you develop any symptoms (ie: fever, flu-like symptoms, shortness of breath, cough etc.) before then, please call [phone].  If you test positive for Covid 19 in the 2 weeks post procedure, please call and report this information to us.    If any biopsies were taken you will be contacted by phone or by letter within the next 1-3 weeks.  Please call us at [phone] if you have not heard about the biopsies in 3 weeks.    SIGNATURES/CONFIDENTIALITY: You and/or your care partner have signed paperwork which will be entered into your electronic medical record.  These signatures attest to the fact that that the information above on your After Visit Summary has been reviewed and is understood.  Full responsibility of the confidentiality of this discharge information lies with you and/or your care-partner. 

## 2019-04-24 NOTE — Progress Notes (Signed)
Report given to PACU, vss 

## 2019-04-24 NOTE — Progress Notes (Signed)
Called to room to assist during endoscopic procedure.  Patient ID and intended procedure confirmed with present staff. Received instructions for my participation in the procedure from the performing physician.  

## 2019-04-24 NOTE — Progress Notes (Signed)
Pt. Reports no change in his medical or surgical history since his pre-visit 04/10/2019.

## 2019-04-26 ENCOUNTER — Telehealth: Payer: Self-pay | Admitting: *Deleted

## 2019-04-26 NOTE — Telephone Encounter (Signed)
  Follow up Call-  Call back number 04/24/2019  Post procedure Call Back phone  # [phone]  Permission to leave phone message Yes  Some recent data might be hidden     Patient questions:  Someone answered the phone. I could hear talking in the background, but no one addressed me. Second call.

## 2019-04-26 NOTE — Telephone Encounter (Signed)
  Follow up Call-  Call back number 04/24/2019  Post procedure Call Back phone  # [phone]  Permission to leave phone message Yes  Some recent data might be hidden   [facility]

## 2019-04-27 ENCOUNTER — Encounter: Payer: Self-pay | Admitting: Gastroenterology

## 2019-05-03 ENCOUNTER — Other Ambulatory Visit: Payer: Self-pay | Admitting: Pulmonary Disease

## 2019-05-03 MED ORDER — SUNOSI 150 MG PO TABS: 1.0000 | ORAL_TABLET | Freq: Every day | ORAL | 1 refills | Status: DC

## 2019-05-03 MED ORDER — SUNOSI 150 MG PO TABS: 150.0000 mg | ORAL_TABLET | Freq: Every day | ORAL | 1 refills | Status: DC

## 2019-05-08 ENCOUNTER — Ambulatory Visit (INDEPENDENT_AMBULATORY_CARE_PROVIDER_SITE_OTHER): Payer: No Typology Code available for payment source | Admitting: Bariatrics

## 2019-05-09 ENCOUNTER — Other Ambulatory Visit (INDEPENDENT_AMBULATORY_CARE_PROVIDER_SITE_OTHER): Payer: Self-pay | Admitting: Bariatrics

## 2019-05-09 DIAGNOSIS — E559 Vitamin D deficiency, unspecified: Secondary | ICD-10-CM

## 2019-05-19 ENCOUNTER — Other Ambulatory Visit: Payer: Self-pay | Admitting: Pulmonary Disease

## 2019-05-19 ENCOUNTER — Other Ambulatory Visit (INDEPENDENT_AMBULATORY_CARE_PROVIDER_SITE_OTHER): Payer: Self-pay | Admitting: Bariatrics

## 2019-05-19 DIAGNOSIS — E559 Vitamin D deficiency, unspecified: Secondary | ICD-10-CM

## 2019-05-19 DIAGNOSIS — Z9989 Dependence on other enabling machines and devices: Secondary | ICD-10-CM

## 2019-05-19 DIAGNOSIS — G4733 Obstructive sleep apnea (adult) (pediatric): Secondary | ICD-10-CM

## 2019-05-26 ENCOUNTER — Ambulatory Visit: Payer: No Typology Code available for payment source

## 2019-05-26 ENCOUNTER — Other Ambulatory Visit: Payer: Self-pay

## 2019-05-26 DIAGNOSIS — G4733 Obstructive sleep apnea (adult) (pediatric): Secondary | ICD-10-CM

## 2019-05-26 DIAGNOSIS — Z9989 Dependence on other enabling machines and devices: Secondary | ICD-10-CM

## 2019-05-30 DIAGNOSIS — G4733 Obstructive sleep apnea (adult) (pediatric): Secondary | ICD-10-CM

## 2019-06-01 ENCOUNTER — Telehealth: Payer: Self-pay | Admitting: Pulmonary Disease

## 2019-06-01 DIAGNOSIS — G4733 Obstructive sleep apnea (adult) (pediatric): Secondary | ICD-10-CM

## 2019-06-01 NOTE — Telephone Encounter (Signed)
Called and spoke with Patient.  Results and recommendations from Dr. Ander Slade given.  Understanding stated.  Patient aware of follow up within 3 months of starting cpap for compliance. Patient is unsure who he received current cpap from.  Patient stated current cpap is broken, and has not been able to use it. DME order placed.  Nothing further at this time.   Dr. Ander Slade has reviewed the home sleep test this showed severe obstructive sleep apnea.   Recommendations   Treatment options are CPAP with the settings auto 5 to 20.    Weight loss measures .   Advise against driving while sleepy & against medication with sedative side effects.    Make appointment for 3 months for compliance with download with Dr. Ander Slade.

## 2019-06-02 LAB — HM DIABETES EYE EXAM

## 2019-06-16 ENCOUNTER — Other Ambulatory Visit: Payer: Self-pay | Admitting: Family Medicine

## 2019-06-30 ENCOUNTER — Telehealth: Payer: Self-pay | Admitting: Pulmonary Disease

## 2019-06-30 NOTE — Telephone Encounter (Signed)
Left message for patient to call back  

## 2019-07-03 NOTE — Telephone Encounter (Signed)
lmtcb for pt.  

## 2019-07-04 NOTE — Telephone Encounter (Signed)
Unable to reach the patient on [phone], has a busy signal.  Had to leave a message on [phone] to advise I attempted to reach him on the other number twice, but voicemail was not available. Requested he call back to let us know if he actually received the cpap machine that was ordered in January and has been using for 30 days, if so an appointment for follow up will need to be scheduled.  Otherwise, if he did not get the machine to let us know so we can follow up with the DME company.

## 2019-07-04 NOTE — Telephone Encounter (Signed)
Pt returning call.  [phone] until 5:30.

## 2019-07-04 NOTE — Telephone Encounter (Signed)
LMTCB

## 2019-07-05 NOTE — Telephone Encounter (Signed)
LMTCB x2 for pt 

## 2019-07-06 NOTE — Telephone Encounter (Signed)
Tried both numbers we have on file for the pt. Another message has been left with him to return our call. We have attempted to contact pt several times with no success or call back from pt. Per triage protocol, message will be closed.

## 2019-07-07 ENCOUNTER — Telehealth: Payer: Self-pay | Admitting: Pulmonary Disease

## 2019-07-07 NOTE — Telephone Encounter (Signed)
I called [employer] but there was no answer. It kept ringing and I was unable to leave message.  I called the pt and he didn't answer. I couldn't leave VM because there was no VM. Will try again.

## 2019-07-07 NOTE — Telephone Encounter (Signed)
Continued...  He can be reached on his landline if doesn't pick up his mobile phone. Landline number is [phone].

## 2019-07-10 NOTE — Telephone Encounter (Signed)
Can you guys help with this? Pt's order for CPAP was placed back in January.

## 2019-07-10 NOTE — Telephone Encounter (Signed)
Message sent to [facility] for an update.

## 2019-07-10 NOTE — Telephone Encounter (Signed)
Response from [employer]:  Charmian Muff sent to Vella Kohler D  We had been waiting on auth from his insurance, but apparently that fell thru the cracks. We will get him scheduled ASAP!!

## 2019-07-10 NOTE — Telephone Encounter (Signed)
LMOM TCB x1 - just need to let patient know that [employer] was waiting on auth from the insurance but 'it must have fell through the cracks' and he will be called ASAP to schedule.

## 2019-07-11 NOTE — Telephone Encounter (Signed)
LMTCB x2 for pt 

## 2019-07-12 ENCOUNTER — Telehealth: Payer: Self-pay | Admitting: Pulmonary Disease

## 2019-07-12 NOTE — Telephone Encounter (Signed)
ATC Patient. Left message on VM to let him know we are aware he has not received cpap, and [facility] is currently working on his order ASAP.

## 2019-07-12 NOTE — Telephone Encounter (Signed)
LMTCB x3 for pt. We have attempted to contact pt several times with no success or call back from pt. Per triage protocol, message will be closed.   

## 2019-07-17 ENCOUNTER — Other Ambulatory Visit: Payer: Self-pay | Admitting: Family Medicine

## 2019-08-21 ENCOUNTER — Telehealth: Payer: Self-pay | Admitting: Pulmonary Disease

## 2019-08-21 ENCOUNTER — Other Ambulatory Visit: Payer: Self-pay | Admitting: Pulmonary Disease

## 2019-08-24 ENCOUNTER — Other Ambulatory Visit: Payer: Self-pay | Admitting: Pulmonary Disease

## 2019-08-24 MED ORDER — SUNOSI 150 MG PO TABS: 150.0000 mg | ORAL_TABLET | Freq: Every day | ORAL | 1 refills | Status: DC

## 2019-10-11 ENCOUNTER — Other Ambulatory Visit: Payer: Self-pay | Admitting: Family Medicine

## 2019-10-12 NOTE — Telephone Encounter (Signed)
Request for refill sent 08/23/2019 by Dr. Ander Slade.

## 2019-12-06 ENCOUNTER — Other Ambulatory Visit: Payer: Self-pay | Admitting: Family Medicine

## 2019-12-23 ENCOUNTER — Other Ambulatory Visit: Payer: Self-pay | Admitting: Pulmonary Disease

## 2019-12-24 ENCOUNTER — Other Ambulatory Visit: Payer: Self-pay | Admitting: Family Medicine

## 2019-12-27 NOTE — Telephone Encounter (Signed)
Dr. Ander Slade got refill request on this patient's SUNOSI. Are you ok with refilling this? Last time he was seen was 05/17/2018. As of right now you have 1 slot open on 8/27 then nothing until your October schedule is open.  If you are ok with refill please sign order below if patient needs appointment let us know so we can call him.

## 2020-01-07 ENCOUNTER — Other Ambulatory Visit: Payer: Self-pay | Admitting: Family Medicine

## 2020-03-30 ENCOUNTER — Other Ambulatory Visit: Payer: Self-pay | Admitting: Family Medicine

## 2020-05-27 ENCOUNTER — Other Ambulatory Visit: Payer: Self-pay | Admitting: Pulmonary Disease

## 2020-05-27 ENCOUNTER — Other Ambulatory Visit: Payer: Self-pay | Admitting: Family Medicine

## 2020-05-27 NOTE — Telephone Encounter (Signed)
Dr. O, please advise if you are okay refilling med. 

## 2020-05-27 NOTE — Telephone Encounter (Signed)
Last office visit 01/16/2019.

## 2020-05-28 ENCOUNTER — Other Ambulatory Visit: Payer: Self-pay | Admitting: Family Medicine

## 2020-05-29 NOTE — Telephone Encounter (Signed)
Called and spoke with pt letting him know that we have sent refill request of his Sunosi to [facility] and stated to him that he will be back in office tomorrow 1/27. Stated to him once we heard from AO if he was fine with [place] refilling his med, we would call him back to let him know what AO said. Pt verbalized understanding.

## 2020-05-30 ENCOUNTER — Other Ambulatory Visit: Payer: Self-pay | Admitting: Family Medicine

## 2020-05-30 NOTE — Telephone Encounter (Signed)
Attempted to call pt to let him know that Rx for Sunosi was refilled but unable to reach. Left pt a detailed message on machine letting him know this info. Nothing further needed.

## 2020-06-26 ENCOUNTER — Other Ambulatory Visit: Payer: Self-pay | Admitting: Family Medicine

## 2020-10-18 ENCOUNTER — Other Ambulatory Visit: Payer: Self-pay | Admitting: Family Medicine

## 2020-10-18 NOTE — Telephone Encounter (Signed)
Last refill-10/11/19

## 2020-10-23 ENCOUNTER — Other Ambulatory Visit: Payer: Self-pay | Admitting: Family Medicine

## 2020-10-23 ENCOUNTER — Other Ambulatory Visit: Payer: Self-pay | Admitting: Pulmonary Disease

## 2020-10-23 ENCOUNTER — Other Ambulatory Visit: Payer: Self-pay

## 2020-10-23 NOTE — Telephone Encounter (Signed)
Attempted to call pt to schedule appointment LOV was in 2020, not able to leave a message

## 2020-10-24 ENCOUNTER — Ambulatory Visit (INDEPENDENT_AMBULATORY_CARE_PROVIDER_SITE_OTHER): Payer: Medicare Other | Admitting: Family Medicine

## 2020-10-24 ENCOUNTER — Encounter: Payer: Self-pay | Admitting: Family Medicine

## 2020-10-24 VITALS — BP 100/70 | HR 95 | Temp 98.6°F | Wt 300.0 lb

## 2020-10-24 DIAGNOSIS — M109 Gout, unspecified: Secondary | ICD-10-CM | POA: Diagnosis not present

## 2020-10-24 DIAGNOSIS — L719 Rosacea, unspecified: Secondary | ICD-10-CM

## 2020-10-24 DIAGNOSIS — G2581 Restless legs syndrome: Secondary | ICD-10-CM

## 2020-10-24 DIAGNOSIS — I10 Essential (primary) hypertension: Secondary | ICD-10-CM | POA: Diagnosis not present

## 2020-10-24 MED ORDER — ROSUVASTATIN CALCIUM 20 MG PO TABS: 20.0000 mg | ORAL_TABLET | Freq: Every day | ORAL | 3 refills | Status: AC

## 2020-10-24 MED ORDER — DOXYCYCLINE HYCLATE 100 MG PO CAPS: 100.0000 mg | ORAL_CAPSULE | Freq: Every day | ORAL | 3 refills | Status: AC

## 2020-10-24 MED ORDER — IRBESARTAN 75 MG PO TABS: 75.0000 mg | ORAL_TABLET | Freq: Every day | ORAL | 3 refills | Status: AC

## 2020-10-24 MED ORDER — FENOFIBRATE 145 MG PO TABS: 145.0000 mg | ORAL_TABLET | Freq: Every day | ORAL | 3 refills | Status: AC

## 2020-10-24 MED ORDER — COLCHICINE 0.6 MG PO TABS: 0.6000 mg | ORAL_TABLET | Freq: Four times a day (QID) | ORAL | 2 refills | Status: AC | PRN

## 2020-10-24 NOTE — Progress Notes (Signed)
   Subjective:    Patient ID: Edward Olsen, male    DOB: [birth year], 66 y.o.   MRN: [id number]  HPI Here to follow up and for refills. He feels good. His BP is stable. He sees Dr. Chalmers Cater for treatment of his diabetes and hypothyroidism. She also checks his lipids.    Review of Systems  Constitutional: Negative.   Respiratory: Negative.    Cardiovascular: Negative.       Objective:   Physical Exam Constitutional:      Appearance: He is obese.  Cardiovascular:     Rate and Rhythm: Normal rate and regular rhythm.     Pulses: Normal pulses.     Heart sounds: Normal heart sounds.  Pulmonary:     Effort: Pulmonary effort is normal.     Breath sounds: Normal breath sounds.  Neurological:     Mental Status: He is alert.          Assessment & Plan:  He is doing well. The HTN, gout, restless legs, and cholesterol are well controlled. Meds were refilled.  Alysia Penna, MD

## 2020-10-31 ENCOUNTER — Other Ambulatory Visit: Payer: Self-pay | Admitting: Family Medicine

## 2021-08-18 ENCOUNTER — Other Ambulatory Visit: Payer: Self-pay

## 2021-08-18 ENCOUNTER — Telehealth: Payer: Self-pay | Admitting: Family Medicine

## 2021-08-18 DIAGNOSIS — G2581 Restless legs syndrome: Secondary | ICD-10-CM

## 2021-08-18 MED ORDER — PRAMIPEXOLE DIHYDROCHLORIDE 1 MG PO TABS: ORAL_TABLET | ORAL | 0 refills | Status: AC

## 2021-08-18 NOTE — Telephone Encounter (Signed)
Requested refill has been sent to [facility] on [address].  ?

## 2021-08-18 NOTE — Telephone Encounter (Signed)
Pt request refill of his pramipexole (MIRAPEX) 1 MG tablet [[id number]] to be [facility] to [facility] on [address] [phone] (P) ?

## 2021-10-03 ENCOUNTER — Telehealth: Payer: Self-pay | Admitting: Family Medicine

## 2021-10-03 NOTE — Telephone Encounter (Signed)
Left message for patient to call back and schedule Medicare Annual Wellness Visit (AWV) either virtually or in office. Left  my Herbie Drape number [phone]   awvi per palmtto 10/02/21 please schedule at anytime with [facility] Nurse Health Advisor 1 or 2   This should be a 45 minute visit.

## 2021-10-30 ENCOUNTER — Telehealth (INDEPENDENT_AMBULATORY_CARE_PROVIDER_SITE_OTHER): Payer: Medicare Other | Admitting: Family Medicine

## 2021-10-30 ENCOUNTER — Encounter: Payer: Self-pay | Admitting: Family Medicine

## 2021-10-30 ENCOUNTER — Telehealth: Payer: Self-pay | Admitting: Family Medicine

## 2021-10-30 DIAGNOSIS — R32 Unspecified urinary incontinence: Secondary | ICD-10-CM | POA: Diagnosis not present

## 2021-10-30 DIAGNOSIS — R5383 Other fatigue: Secondary | ICD-10-CM | POA: Diagnosis not present

## 2021-10-30 DIAGNOSIS — R159 Full incontinence of feces: Secondary | ICD-10-CM

## 2021-10-30 NOTE — Telephone Encounter (Signed)
Spoke with the patient and scheduled an appt today with Dr Maudie Mercury at 11:20am for evaluation.  Patient is aware the message was sent to Dr Sarajane Jews for recommendations for a new PCP in [place], [place].

## 2021-10-30 NOTE — Telephone Encounter (Signed)
Pt would also like a referral to a Primary Care doctor in [place].

## 2021-10-30 NOTE — Patient Instructions (Addendum)
Your symptoms are very concerning and you will need inperson medical care/evaluation to determine the cause and treatment. Please go to the urgent or emergency medical care center today for evaluation.       It was nice to meet you today. I help  out with telemedicine visits on Tuesdays and Thursdays and am happy to help if you need a virtual follow up visit on those days. Otherwise, if you have any concerns or questions following this visit please schedule a follow up visit with your Primary Care office or seek care at a local urgent care clinic to avoid delays in care. If you are having severe or life threatening symptoms please call 911 and/or go to the nearest emergency room.

## 2021-10-30 NOTE — Telephone Encounter (Signed)
Pt called this morning with questions regarding incontinence & bowels issues. Pt moved to [place] (3 hrs away) and cannot come in for a visit.   [phone]  Please advise.

## 2021-10-30 NOTE — Progress Notes (Signed)
Virtual Visit via Video Note  I connected with Edward Olsen  on 10/30/21 at 11:20 AM EDT by a video enabled telemedicine application and verified that I am speaking with the correct person using two identifiers.  Location patient: [place] Location provider:work or home office Persons participating in the virtual visit: patient, provider  I discussed the limitations and requested verbal permission for telemedicine visit. The patient expressed understanding and agreed to proceed.   HPI:  Acute telemedicine visit for several issues urinary and stool incontinence: -some urinary incontinence for some time but worse the last few months -now acutely has developed stool incontinence - soiled himself 4-5 times this past week or so without warning -has felt quite tired for some time -denies diarrhea, vomiting, fevers, melena, hematuria, hematochezia,unexplained wt loss, constipation recently (did have issues with constipation in the past) -has had some abd discomfort too at times  -has some back pain at times as well -he recently moved to [place] and no longer has a PCP but called old PCP office today to see who he should see for [place]CP in [place] and was given vv. Can not come back to [place].     ROS: See pertinent positives and negatives per HPI.  Past Medical History:  Diagnosis Date   Arthritis    Back pain    Cataract    forming   Diabetes mellitus    sees Dr. Chalmers Cater - type 2   Edema, lower extremity    Glaucoma    very mild-no treatment   Hemorrhoids    Hyperlipidemia    Hypertension    Hypothyroidism    Joint pain    Kidney problem    Knee pain    Normal cardiac stress test Jan. 2015    per Dr. Lou Miner    Obesity    Restless leg syndrome    Rosacea    Shingles Feb. 2015   Sleep apnea    uses CPAP nightly   Sleep apnea, obstructive    wears CPAP    SOB (shortness of breath)    Thrombocytopenia ([place])    sees Dr. Shanon Brow Chism    Thyroid disease     Past Surgical History:  Procedure  Laterality Date   CARPAL TUNNEL RELEASE Bilateral    COLONOSCOPY  07/20/2012   per Dr. Ardis Hughs, adenomatous polyps, repeat in 5 yrs    EYE SURGERY     [place]  07-08-10   per Dr. Ardis Hughs, internal hemorrhoids    HEMORRHOID SURGERY  2012   [place]     LASIK     POLYPECTOMY     SEPTOPLASTY  2013   SIGMOIDOSCOPY     WISDOM TOOTH EXTRACTION       Current Outpatient Medications:    BD VEO INSULIN SYRINGE U/F 31G X 15/64" 1 ML MISC, USE WITH INSULIN THREE TIMES A DAY, Disp: , Rfl:    colchicine 0.6 MG tablet, Take 1 tablet (0.6 mg total) by mouth every 6 (six) hours as needed (gout)., Disp: 60 tablet, Rfl: 2   doxycycline (VIBRAMYCIN) 100 MG capsule, Take 1 capsule (100 mg total) by mouth daily., Disp: 90 capsule, Rfl: 3   fenofibrate (TRICOR) 145 MG tablet, Take 1 tablet (145 mg total) by mouth daily., Disp: 90 tablet, Rfl: 3   ibuprofen (ADVIL) 600 MG tablet, Take 600 mg by mouth every 6 (six) hours as needed., Disp: , Rfl:    insulin NPH-regular Human (NOVOLIN 70/30) (70-30) 100  UNIT/ML injection, Inject into the skin. 65/45/75 units, Disp: , Rfl:    irbesartan (AVAPRO) 75 MG tablet, Take 1 tablet (75 mg total) by mouth daily., Disp: 90 tablet, Rfl: 3   levothyroxine (SYNTHROID, LEVOTHROID) 112 MCG tablet, Take 224 mcg by mouth daily before breakfast., Disp: , Rfl:    pramipexole (MIRAPEX) 1 MG tablet, Take  5 tablets at bedtime., Disp: 450 tablet, Rfl: 0   rosuvastatin (CRESTOR) 20 MG tablet, Take 1 tablet (20 mg total) by mouth daily., Disp: 90 tablet, Rfl: 3   SUNOSI 150 MG TABS, TAKE 150 MG BY MOUTH DAILY., Disp: 30 tablet, Rfl: 1  EXAM:  VITALS per patient if applicable:  GENERAL: alert, oriented, appears well and in no acute distress  HEENT: atraumatic, conjunttiva clear, no obvious abnormalities on inspection of external nose and ears  NECK: normal movements of the head and neck  LUNGS: on inspection no signs of respiratory distress, breathing rate  appears normal, no obvious gross SOB, gasping or wheezing  CV: no obvious cyanosis  MS: moves all visible extremities without noticeable abnormality  PSYCH/NEURO: pleasant and cooperative, no obvious depression or anxiety, speech and thought processing grossly intact  ASSESSMENT AND PLAN:  Discussed the following assessment and plan:  Incontinence of feces, unspecified fecal incontinence type  Urinary incontinence, unspecified type  Other fatigue  -we discussed possible serious and likely etiologies, options for evaluation and workup, limitations of telemedicine visit vs in person visit, treatment, treatment risks and precautions. Advised given the severity of his reported symptoms and potential etiologies he needs inperson evaluation. Given has no PCP currently nearby in his new town, advised ER or UCC today in his location as he needs exam and likely labs/imaging/possible specialist consult to rule out serious potential etiologies. Looked up several options for him. He agrees to seek inperson care today.    I discussed the assessment and treatment plan with the patient. The patient was provided an opportunity to ask questions and all were answered. The patient agreed with the plan and demonstrated an understanding of the instructions.     Lucretia Kern, DO

## 2021-11-03 NOTE — Telephone Encounter (Signed)
Noted  

## 2021-11-06 ENCOUNTER — Encounter: Payer: Self-pay | Admitting: Family Medicine

## 2021-11-06 NOTE — Progress Notes (Signed)
Called to check on patient. He reports he was seen and evaluated and had imaging and initial workup that was negative including and MRI. He also reports he has follow up plans with local doctors and that symptoms have improved with cessation of drinking"tea from a diffuser." He was appreciative of me checking in on him. Advised he ensure close follow up to ensure doing well and to pursue any further needed follow up. He agreed.

## 2021-12-10 ENCOUNTER — Encounter (INDEPENDENT_AMBULATORY_CARE_PROVIDER_SITE_OTHER): Payer: Self-pay

## 2022-02-24 ENCOUNTER — Telehealth: Payer: Self-pay | Admitting: Licensed Clinical Social Worker

## 2022-02-24 NOTE — Patient Outreach (Signed)
  Care Coordination   02/24/2022 Name: Edward Olsen MRN: [id number] DOB: [birth year]   Care Coordination Outreach Attempts:  An unsuccessful telephone outreach was attempted today to offer the patient information about available care coordination services as a benefit of their health plan.   Follow Up Plan:  Additional outreach attempts will be made to offer the patient care coordination information and services.   Encounter Outcome:  No Answer  Care Coordination Interventions Activated:  No   Care Coordination Interventions:  No, not indicated    Christa See, MSW, [email or web address] Phone [phone] 12:20 PM

## 2022-07-01 ENCOUNTER — Telehealth: Payer: Self-pay

## 2022-07-01 ENCOUNTER — Other Ambulatory Visit: Payer: Self-pay

## 2022-07-01 DIAGNOSIS — E119 Type 2 diabetes mellitus without complications: Secondary | ICD-10-CM

## 2022-07-01 DIAGNOSIS — I10 Essential (primary) hypertension: Secondary | ICD-10-CM

## 2022-07-01 NOTE — Progress Notes (Signed)
   Care Guide Note  07/01/2022 Name: ATHEL ELBERS MRN: [id number] DOB: [birth year]  Referred by: Laurey Morale, MD Reason for referral : Care Coordination (Outreach to schedule with pharm d )   SUSANO DOERFLINGER is a 67 y.o. year old male who is a primary care patient of Laurey Morale, MD. Jeani Hawking was referred to the pharmacist for assistance related to HTN.    An unsuccessful telephone outreach was attempted today to contact the patient who was referred to the pharmacy team for assistance with medication assistance. Additional attempts will be made to contact the patient.   Noreene Larsson, [place], [place] [address] Direct Dial: [phone] [email or web address]

## 2022-07-15 ENCOUNTER — Encounter: Payer: Self-pay | Admitting: Gastroenterology
